# Patient Record
Sex: Male | Born: 1960
Health system: Southern US, Community
[De-identification: ages and names within clinical notes are randomized; demographics above are authoritative.]

## PROBLEM LIST (undated history)

## (undated) DIAGNOSIS — R079 Chest pain, unspecified: Secondary | ICD-10-CM

## (undated) DIAGNOSIS — K760 Fatty (change of) liver, not elsewhere classified: Secondary | ICD-10-CM

## (undated) DIAGNOSIS — K219 Gastro-esophageal reflux disease without esophagitis: Secondary | ICD-10-CM

## (undated) DIAGNOSIS — N179 Acute kidney failure, unspecified: Secondary | ICD-10-CM

## (undated) DIAGNOSIS — K8020 Calculus of gallbladder without cholecystitis without obstruction: Secondary | ICD-10-CM

## (undated) DIAGNOSIS — I10 Essential (primary) hypertension: Secondary | ICD-10-CM

## (undated) DIAGNOSIS — M1990 Unspecified osteoarthritis, unspecified site: Secondary | ICD-10-CM

## (undated) HISTORY — PX: WRIST SURGERY: SHX841

## (undated) HISTORY — PX: COLONOSCOPY: SHX174

---

## 2003-01-26 ENCOUNTER — Encounter: Payer: Self-pay | Admitting: *Deleted

## 2003-01-26 ENCOUNTER — Encounter: Admission: RE | Admit: 2003-01-26 | Discharge: 2003-01-26 | Payer: Self-pay | Admitting: *Deleted

## 2010-02-06 ENCOUNTER — Encounter: Admission: RE | Admit: 2010-02-06 | Discharge: 2010-02-06 | Payer: Self-pay | Admitting: Occupational Medicine

## 2010-05-29 ENCOUNTER — Encounter: Admission: RE | Admit: 2010-05-29 | Discharge: 2010-05-29 | Payer: Self-pay | Admitting: Neurology

## 2018-05-31 DIAGNOSIS — K8020 Calculus of gallbladder without cholecystitis without obstruction: Secondary | ICD-10-CM

## 2018-05-31 HISTORY — DX: Calculus of gallbladder without cholecystitis without obstruction: K80.20

## 2018-06-08 DIAGNOSIS — I10 Essential (primary) hypertension: Secondary | ICD-10-CM | POA: Diagnosis not present

## 2018-06-08 DIAGNOSIS — Z131 Encounter for screening for diabetes mellitus: Secondary | ICD-10-CM | POA: Diagnosis not present

## 2018-06-08 DIAGNOSIS — K219 Gastro-esophageal reflux disease without esophagitis: Secondary | ICD-10-CM | POA: Diagnosis not present

## 2018-06-08 DIAGNOSIS — Z125 Encounter for screening for malignant neoplasm of prostate: Secondary | ICD-10-CM | POA: Diagnosis not present

## 2018-06-09 DIAGNOSIS — K219 Gastro-esophageal reflux disease without esophagitis: Secondary | ICD-10-CM | POA: Diagnosis not present

## 2018-06-09 DIAGNOSIS — I10 Essential (primary) hypertension: Secondary | ICD-10-CM | POA: Diagnosis not present

## 2018-06-15 DIAGNOSIS — K219 Gastro-esophageal reflux disease without esophagitis: Secondary | ICD-10-CM | POA: Diagnosis not present

## 2018-06-15 DIAGNOSIS — I10 Essential (primary) hypertension: Secondary | ICD-10-CM | POA: Diagnosis not present

## 2018-06-23 ENCOUNTER — Emergency Department (HOSPITAL_COMMUNITY): Payer: 59

## 2018-06-23 ENCOUNTER — Observation Stay (HOSPITAL_COMMUNITY): Payer: 59

## 2018-06-23 ENCOUNTER — Observation Stay (HOSPITAL_COMMUNITY)
Admission: EM | Admit: 2018-06-23 | Discharge: 2018-06-24 | Disposition: A | Discharge status: Home / Self Care | Payer: 59 | Attending: Internal Medicine | Admitting: Internal Medicine

## 2018-06-23 ENCOUNTER — Other Ambulatory Visit: Payer: Self-pay

## 2018-06-23 ENCOUNTER — Encounter (HOSPITAL_COMMUNITY): Payer: Self-pay | Admitting: Emergency Medicine

## 2018-06-23 ENCOUNTER — Other Ambulatory Visit (HOSPITAL_COMMUNITY): Payer: 59

## 2018-06-23 DIAGNOSIS — K5900 Constipation, unspecified: Secondary | ICD-10-CM | POA: Diagnosis not present

## 2018-06-23 DIAGNOSIS — K30 Functional dyspepsia: Secondary | ICD-10-CM

## 2018-06-23 DIAGNOSIS — K8020 Calculus of gallbladder without cholecystitis without obstruction: Secondary | ICD-10-CM | POA: Diagnosis present

## 2018-06-23 DIAGNOSIS — R0789 Other chest pain: Principal | ICD-10-CM | POA: Diagnosis present

## 2018-06-23 DIAGNOSIS — R202 Paresthesia of skin: Secondary | ICD-10-CM | POA: Diagnosis not present

## 2018-06-23 DIAGNOSIS — K760 Fatty (change of) liver, not elsewhere classified: Secondary | ICD-10-CM | POA: Diagnosis present

## 2018-06-23 DIAGNOSIS — M5030 Other cervical disc degeneration, unspecified cervical region: Secondary | ICD-10-CM | POA: Diagnosis present

## 2018-06-23 DIAGNOSIS — R7989 Other specified abnormal findings of blood chemistry: Secondary | ICD-10-CM | POA: Insufficient documentation

## 2018-06-23 DIAGNOSIS — R000 Tachycardia, unspecified: Secondary | ICD-10-CM | POA: Diagnosis not present

## 2018-06-23 DIAGNOSIS — Z8249 Family history of ischemic heart disease and other diseases of the circulatory system: Secondary | ICD-10-CM | POA: Diagnosis not present

## 2018-06-23 DIAGNOSIS — R1114 Bilious vomiting: Secondary | ICD-10-CM | POA: Diagnosis not present

## 2018-06-23 DIAGNOSIS — M47816 Spondylosis without myelopathy or radiculopathy, lumbar region: Secondary | ICD-10-CM | POA: Diagnosis not present

## 2018-06-23 DIAGNOSIS — M542 Cervicalgia: Secondary | ICD-10-CM | POA: Diagnosis not present

## 2018-06-23 DIAGNOSIS — I10 Essential (primary) hypertension: Secondary | ICD-10-CM | POA: Diagnosis not present

## 2018-06-23 DIAGNOSIS — K7689 Other specified diseases of liver: Secondary | ICD-10-CM | POA: Insufficient documentation

## 2018-06-23 DIAGNOSIS — N179 Acute kidney failure, unspecified: Secondary | ICD-10-CM | POA: Diagnosis not present

## 2018-06-23 DIAGNOSIS — R61 Generalized hyperhidrosis: Secondary | ICD-10-CM

## 2018-06-23 DIAGNOSIS — R079 Chest pain, unspecified: Secondary | ICD-10-CM

## 2018-06-23 DIAGNOSIS — Z79899 Other long term (current) drug therapy: Secondary | ICD-10-CM | POA: Diagnosis not present

## 2018-06-23 DIAGNOSIS — Z7982 Long term (current) use of aspirin: Secondary | ICD-10-CM | POA: Insufficient documentation

## 2018-06-23 DIAGNOSIS — K219 Gastro-esophageal reflux disease without esophagitis: Secondary | ICD-10-CM | POA: Diagnosis not present

## 2018-06-23 DIAGNOSIS — R110 Nausea: Secondary | ICD-10-CM

## 2018-06-23 DIAGNOSIS — I088 Other rheumatic multiple valve diseases: Secondary | ICD-10-CM | POA: Insufficient documentation

## 2018-06-23 DIAGNOSIS — R42 Dizziness and giddiness: Secondary | ICD-10-CM | POA: Diagnosis not present

## 2018-06-23 DIAGNOSIS — R1013 Epigastric pain: Secondary | ICD-10-CM | POA: Diagnosis not present

## 2018-06-23 DIAGNOSIS — T542X1A Toxic effect of corrosive acids and acid-like substances, accidental (unintentional), initial encounter: Secondary | ICD-10-CM | POA: Diagnosis not present

## 2018-06-23 HISTORY — DX: Calculus of gallbladder without cholecystitis without obstruction: K80.20

## 2018-06-23 HISTORY — DX: Chest pain, unspecified: R07.9

## 2018-06-23 HISTORY — DX: Fatty (change of) liver, not elsewhere classified: K76.0

## 2018-06-23 HISTORY — DX: Essential (primary) hypertension: I10

## 2018-06-23 HISTORY — DX: Acute kidney failure, unspecified: N17.9

## 2018-06-23 LAB — CBC
HCT: 48.8 % (ref 39.0–52.0)
HEMATOCRIT: 43.2 % (ref 39.0–52.0)
HEMOGLOBIN: 13.6 g/dL (ref 13.0–17.0)
Hemoglobin: 15 g/dL (ref 13.0–17.0)
MCH: 26.5 pg (ref 26.0–34.0)
MCH: 27.3 pg (ref 26.0–34.0)
MCHC: 30.7 g/dL (ref 30.0–36.0)
MCHC: 31.5 g/dL (ref 30.0–36.0)
MCV: 86.1 fL (ref 80.0–100.0)
MCV: 86.6 fL (ref 80.0–100.0)
NRBC: 0 % (ref 0.0–0.2)
PLATELETS: 145 10*3/uL — AB (ref 150–400)
Platelets: 103 10*3/uL — ABNORMAL LOW (ref 150–400)
RBC: 5.67 MIL/uL (ref 4.22–5.81)
RBC: 4.99 MIL/uL (ref 4.22–5.81)
RDW: 13.4 % (ref 11.5–15.5)
RDW: 13.5 % (ref 11.5–15.5)
WBC: 4.7 10*3/uL (ref 4.0–10.5)
WBC: 4.1 10*3/uL (ref 4.0–10.5)
nRBC: 0 % (ref 0.0–0.2)

## 2018-06-23 LAB — HEPATIC FUNCTION PANEL
ALT: 199 U/L — AB (ref 0–44)
AST: 108 U/L — AB (ref 15–41)
Albumin: 3.8 g/dL (ref 3.5–5.0)
Alkaline Phosphatase: 58 U/L (ref 38–126)
BILIRUBIN DIRECT: 0.3 mg/dL — AB (ref 0.0–0.2)
BILIRUBIN TOTAL: 1.1 mg/dL (ref 0.3–1.2)
Indirect Bilirubin: 0.8 mg/dL (ref 0.3–0.9)
Total Protein: 7.3 g/dL (ref 6.5–8.1)

## 2018-06-23 LAB — BASIC METABOLIC PANEL
Anion gap: 11 (ref 5–15)
BUN: 18 mg/dL (ref 6–20)
CALCIUM: 9.6 mg/dL (ref 8.9–10.3)
CO2: 27 mmol/L (ref 22–32)
CREATININE: 1.58 mg/dL — AB (ref 0.61–1.24)
Chloride: 100 mmol/L (ref 98–111)
GFR calc Af Amer: 55 mL/min — ABNORMAL LOW (ref >60)
GFR, EST NON AFRICAN AMERICAN: 48 mL/min — AB (ref >60)
GLUCOSE: 160 mg/dL — AB (ref 70–99)
Potassium: 3.5 mmol/L (ref 3.5–5.1)
Sodium: 138 mmol/L (ref 135–145)

## 2018-06-23 LAB — CREATININE, SERUM
Creatinine, Ser: 1.28 mg/dL — ABNORMAL HIGH (ref 0.61–1.24)
GFR calc non Af Amer: >60 mL/min (ref >60)

## 2018-06-23 LAB — I-STAT TROPONIN, ED
TROPONIN I, POC: 0 ng/mL (ref 0.00–0.08)
TROPONIN I, POC: 0 ng/mL (ref 0.00–0.08)

## 2018-06-23 LAB — D-DIMER, QUANTITATIVE (NOT AT ARMC): D DIMER QUANT: 0.28 ug{FEU}/mL (ref 0.00–0.50)

## 2018-06-23 LAB — LIPASE, BLOOD: Lipase: 37 U/L (ref 11–51)

## 2018-06-23 LAB — CBG MONITORING, ED: Glucose-Capillary: 103 mg/dL — ABNORMAL HIGH (ref 70–99)

## 2018-06-23 MED ORDER — SIMETHICONE 80 MG PO CHEW
80.0000 mg | CHEWABLE_TABLET | Freq: Three times a day (TID) | ORAL | Status: DC
  Administered 2018-06-23 – 2018-06-24 (×4): 80 mg via ORAL
  Filled 2018-06-23 (×5): qty 1

## 2018-06-23 MED ORDER — ASPIRIN EC 81 MG PO TBEC: 81.0000 mg | DELAYED_RELEASE_TABLET | Freq: Every day | ORAL | Status: DC

## 2018-06-23 MED ORDER — ONDANSETRON HCL 4 MG/2ML IJ SOLN: 4.0000 mg | Freq: Four times a day (QID) | INTRAMUSCULAR | Status: DC | PRN

## 2018-06-23 MED ORDER — ASPIRIN 81 MG PO CHEW
324.0000 mg | CHEWABLE_TABLET | Freq: Once | ORAL | Status: AC
  Administered 2018-06-23: 324 mg via ORAL
  Filled 2018-06-23: qty 4

## 2018-06-23 MED ORDER — NITROGLYCERIN 0.4 MG SL SUBL: 0.4000 mg | SUBLINGUAL_TABLET | SUBLINGUAL | Status: DC | PRN

## 2018-06-23 MED ORDER — ONDANSETRON HCL 4 MG PO TABS: 4.0000 mg | ORAL_TABLET | Freq: Four times a day (QID) | ORAL | Status: DC | PRN

## 2018-06-23 MED ORDER — FLUTICASONE PROPIONATE 50 MCG/ACT NA SUSP
1.0000 | Freq: Every day | NASAL | Status: DC
  Administered 2018-06-23 – 2018-06-24 (×2): 1 via NASAL
  Filled 2018-06-23: qty 16

## 2018-06-23 MED ORDER — LIDOCAINE VISCOUS HCL 2 % MT SOLN
15.0000 mL | Freq: Once | OROMUCOSAL | Status: AC
  Administered 2018-06-23: 15 mL via ORAL
  Filled 2018-06-23: qty 15

## 2018-06-23 MED ORDER — DICYCLOMINE HCL 10 MG/5ML PO SOLN
10.0000 mg | Freq: Once | ORAL | Status: AC
  Administered 2018-06-23: 10 mg via ORAL
  Filled 2018-06-23: qty 5

## 2018-06-23 MED ORDER — ATORVASTATIN CALCIUM 20 MG PO TABS: 20.0000 mg | ORAL_TABLET | Freq: Every day | ORAL | Status: DC

## 2018-06-23 MED ORDER — SODIUM CHLORIDE 0.9 % IV SOLN
INTRAVENOUS | Status: DC
  Administered 2018-06-23 – 2018-06-24 (×3): via INTRAVENOUS

## 2018-06-23 MED ORDER — ASPIRIN 81 MG PO TBEC: 81.0000 mg | DELAYED_RELEASE_TABLET | Freq: Every day | ORAL | Status: DC

## 2018-06-23 MED ORDER — TRAMADOL HCL 50 MG PO TABS: 50.0000 mg | ORAL_TABLET | Freq: Four times a day (QID) | ORAL | Status: DC | PRN

## 2018-06-23 MED ORDER — ALUM & MAG HYDROXIDE-SIMETH 200-200-20 MG/5ML PO SUSP
15.0000 mL | Freq: Once | ORAL | Status: AC
  Administered 2018-06-23: 15 mL via ORAL
  Filled 2018-06-23: qty 30

## 2018-06-23 MED ORDER — FAMOTIDINE IN NACL 20-0.9 MG/50ML-% IV SOLN
20.0000 mg | Freq: Two times a day (BID) | INTRAVENOUS | Status: DC
  Administered 2018-06-23 – 2018-06-24 (×2): 20 mg via INTRAVENOUS
  Filled 2018-06-23 (×2): qty 50

## 2018-06-23 MED ORDER — LORATADINE 10 MG PO TABS
10.0000 mg | ORAL_TABLET | Freq: Every day | ORAL | Status: DC
  Filled 2018-06-23: qty 1

## 2018-06-23 MED ORDER — SODIUM CHLORIDE 0.9 % IV SOLN: INTRAVENOUS | Status: DC

## 2018-06-23 MED ORDER — SODIUM CHLORIDE 0.9 % IV BOLUS
1000.0000 mL | Freq: Once | INTRAVENOUS | Status: AC
  Administered 2018-06-23: 1000 mL via INTRAVENOUS

## 2018-06-23 MED ORDER — ENOXAPARIN SODIUM 40 MG/0.4ML ~~LOC~~ SOLN
40.0000 mg | SUBCUTANEOUS | Status: DC
  Filled 2018-06-23: qty 0.4

## 2018-06-23 MED ORDER — LOSARTAN POTASSIUM-HCTZ 50-12.5 MG PO TABS: 1.0000 | ORAL_TABLET | Freq: Every day | ORAL | Status: DC

## 2018-06-23 NOTE — ED Notes (Signed)
Patient transported to Ultrasound 

## 2018-06-23 NOTE — H&P (Signed)
History and Physical    DOA: 06/23/2018  PCP: Lucianne Lei, MD  Patient coming from: Home  Chief Complaint: Nausea with dizziness  HPI: Mark Villarreal is a 57 y.o. male with history h/o hypertension, GERD presented with complaints of epigastric discomfort associated with bloating sensation, nausea, dizziness and sweating.  Patient has chronic reflux disease and states he has had these episodes before but more severe now and association with dizziness and sweating is new. Separately he also mentions mild neck and subclavicular chest wall pain associated with tingling along the left upper extremity when he turns his head to the left side.  He clearly gives a positional component and states this has been going on for couple of months.  He has not undergone any work-up for this and he was able to reproduce the symptoms by turning his neck to the left side during my interview/exam.  He denies any exercise intolerance or chest pain per se.  During the work-up in the ED, cardiac symptoms were suspected and cardiac work-up was initiated.  EKG does not have any acute changes and first set of troponin is negative.  Chest x-ray is negative. Labs showed mildly elevated liver enzymes which prompted gallbladder work-up as well.  Hospitalist service called to admit this patient for observation/chest pain evaluation.   Review of Systems: As per HPI otherwise 10 point review of systems negative.    Past Medical History:  Diagnosis Date  . Hypertension   Gastroesophageal reflux disease Social history:  reports that he has never smoked. He has never used smokeless tobacco. He reports that he drinks alcohol. He reports that he does not use drugs.   No Known Allergies  Family history: Patient gives history of CAD in mother and stroke in father   Prior to Admission medications   Medication Sig Start Date End Date Taking? Authorizing Provider  ASPIRIN LOW DOSE 81 MG EC tablet Take 81 mg by mouth at  bedtime.  06/09/18  Yes [provider]  atorvastatin (LIPITOR) 20 MG tablet Take 20 mg by mouth at bedtime. 06/09/18  Yes [provider]  cetirizine (ZYRTEC) 10 MG tablet Take 10 mg by mouth daily. 06/08/18  Yes [provider]  fluticasone (FLONASE) 50 MCG/ACT nasal spray Place 1 spray into both nostrils daily. 06/08/18  Yes [provider]  losartan-hydrochlorothiazide (HYZAAR) 50-12.5 MG tablet Take 1 tablet by mouth at bedtime.  06/08/18  Yes [provider]  traMADol (ULTRAM) 50 MG tablet Take 50-100 mg by mouth every 4 (four) hours as needed. 06/04/18  Yes [provider]  Azilsartan-Chlorthalidone (EDARBYCLOR PO) Take by mouth.    [provider]  penicillin v potassium (VEETID) 500 MG tablet Take 500 mg by mouth daily. 06/04/18   [provider]    Physical Exam: Vitals:   06/23/18 1600 06/23/18 1615 06/23/18 1630 06/23/18 1714  BP: (!) 150/97 (!) 137/93 (!) 154/91 (!) 141/86  Pulse: 88 87 86 83  Resp: 15 (!) 27 18 16   Temp:    99 F (37.2 C)  TempSrc:      SpO2: 95% 97% 96% 99%  Weight:      Height:        Constitutional: NAD, calm, comfortable Vitals:   06/23/18 1600 06/23/18 1615 06/23/18 1630 06/23/18 1714  BP: (!) 150/97 (!) 137/93 (!) 154/91 (!) 141/86  Pulse: 88 87 86 83  Resp: 15 (!) 27 18 16   Temp:    99 F (37.2 C)  TempSrc:      SpO2: 95% 97% 96% 99%  Weight:      Height:       Eyes: PERRL, lids and conjunctivae normal ENMT: Mucous membranes are moist. Posterior pharynx clear of any exudate or lesions.Normal dentition.  Neck: normal, supple, no masses, no thyromegaly Respiratory: clear to auscultation bilaterally, no wheezing, no crackles. Normal respiratory effort. No accessory muscle use.  Cardiovascular: Regular rate and rhythm, no murmurs / rubs / gallops. No extremity edema. 2+ pedal pulses. No carotid bruits.  Abdomen: Epigastric tenderness, no rebound or guarding, no masses  palpated. No hepatosplenomegaly. Bowel sounds positive.  Musculoskeletal: no clubbing / cyanosis. No joint deformity upper and lower extremities. Good ROM, no contractures. Normal muscle tone.  Neurologic: CN 2-12 grossly intact. Sensation intact, DTR normal. Strength 5/5 in all 4.  (Tingling along left upper extremity on turning his neck to the left side elicited) Psychiatric: Normal judgment and insight. Alert and oriented x 3. Normal mood.  SKIN/catheters: no rashes, lesions, ulcers. No induration  Labs on Admission: I have personally reviewed following labs and imaging studies  CBC: Recent Labs  Lab 06/23/18 0957  WBC 4.7  HGB 15.0  HCT 48.8  MCV 86.1  PLT 025*   Basic Metabolic Panel: Recent Labs  Lab 06/23/18 0957  NA 138  K 3.5  CL 100  CO2 27  GLUCOSE 160*  BUN 18  CREATININE 1.58*  CALCIUM 9.6   GFR: Estimated Creatinine Clearance: 60.4 mL/min (A) (by C-G formula based on SCr of 1.58 mg/dL (H)). Liver Function Tests: Recent Labs  Lab 06/23/18 0957  AST 108*  ALT 199*  ALKPHOS 58  BILITOT 1.1  PROT 7.3  ALBUMIN 3.8   Recent Labs  Lab 06/23/18 0957  LIPASE 37   No results for input(s): AMMONIA in the last 168 hours. Coagulation Profile: No results for input(s): INR, PROTIME in the last 168 hours. Cardiac Enzymes: No results for input(s): CKTOTAL, CKMB, CKMBINDEX, TROPONINI in the last 168 hours. BNP (last 3 results) No results for input(s): PROBNP in the last 8760 hours. HbA1C: No results for input(s): HGBA1C in the last 72 hours. CBG: Recent Labs  Lab 06/23/18 1137  GLUCAP 103*   Lipid Profile: No results for input(s): CHOL, HDL, LDLCALC, TRIG, CHOLHDL, LDLDIRECT in the last 72 hours. Thyroid Function Tests: No results for input(s): TSH, T4TOTAL, FREET4, T3FREE, THYROIDAB in the last 72 hours. Anemia Panel: No results for input(s): VITAMINB12, FOLATE, FERRITIN, TIBC, IRON, RETICCTPCT in the last 72 hours. Urine analysis: No results found  for: COLORURINE, APPEARANCEUR, LABSPEC, PHURINE, GLUCOSEU, HGBUR, BILIRUBINUR, KETONESUR, PROTEINUR, UROBILINOGEN, NITRITE, LEUKOCYTESUR  Radiological Exams on Admission: Dg Chest 2 View  Result Date: 06/23/2018 CLINICAL DATA:  Mid chest pain. EXAM: CHEST - 2 VIEW COMPARISON:  None. FINDINGS: The heart size and mediastinal contours are within normal limits. Both lungs are clear. The visualized skeletal structures are unremarkable. IMPRESSION: No active cardiopulmonary disease. Electronically Signed   By: Fidela Salisbury M.D.   On: 06/23/2018 10:27   US Abdomen Limited Ruq  Result Date: 06/23/2018 CLINICAL DATA:  Bilious emesis EXAM: ULTRASOUND ABDOMEN LIMITED RIGHT UPPER QUADRANT COMPARISON:  None. FINDINGS: Gallbladder: Within the gallbladder, there are echogenic foci which move and shadow consistent cholelithiasis. Largest gallstone measures 8 mm. There is a 4 mm echogenic focus in the gallbladder which does not move or shadow, a likely polyp. There is no gallbladder wall thickening or pericholecystic fluid. No sonographic Murphy sign noted by sonographer. Common  bile duct: Diameter: 6 mm. No intrahepatic or extrahepatic biliary duct dilatation. Liver: There is a cyst in the right lobe of the liver measuring 1.5 x 1.4 x 1.5 cm. Liver echogenicity overall is increased. Portal vein is patent on color Doppler imaging with normal direction of blood flow towards the liver. IMPRESSION: 1. Cholelithiasis. No gallbladder wall thickening or pericholecystic fluid. There is a 4 mm apparent polyp along the gallbladder wall as well. Per consensus guidelines, a polyp of this small size does not warrant additional imaging surveillance. 2. Small cyst in right lobe of liver. No other focal liver lesion identified. There is increase in liver echogenicity, a finding indicative of underlying hepatic steatosis. Electronically Signed   By: Lowella Grip III M.D.   On: 06/23/2018 16:20    Assessment and Plan:    1.  Dyspepsia/dizziness/sweating: Likely vagal reaction to gastric symptoms.  Will admit for observation given severe nausea with IV emetics, IV fluids and clear liquid diet with instructions to advance as tolerated.  2.  Atypical subclavicular/upper chest wall pain: Patient denies any retrosternal/parasternal left-sided chest pain.  I do not suspect cardiac symptoms at this point.  Subclavicular pain appears to be related to neck issues.  Will however cycle cardiac enzymes overnight.  Obtain echo  3.  Neck pain/left upper extremity tingling: Appears to be related to cervical disease.  Will start off with x-ray to rule out spondylosis, if negative consider MRI neck given ongoing symptoms.  4.  Hypertension hold diuretics and ACE inhibitors in concern for renal function.  5.  Elevated LFTs: Bilirubin appears normal.?  Medication related.  Drinks alcohol occasionally.  Ultrasonogram gallbladder appears normal except for a small gallbladder nonobstructing polyp.  Check hepatitis profile  6.?  AKI: No old labs to compare.  Patient on diuretics/ACE inhibitors at baseline.  Hold and hydrate  DVT prophylaxis: Lovenox  Code Status: Full code  Family Communication: Discussed with patient. Health care proxy would be  Consults called: None Admission status:  Patient admitted as observation as anticipated LOS less than 2 midnights    Guilford Shi MD [facility] Pager [phone]  If 7PM-7AM, please contact night-coverage [email or web address] Password [facility]  06/23/2018, 5:28 PM

## 2018-06-23 NOTE — ED Notes (Signed)
Blood work then [employer]

## 2018-06-23 NOTE — Progress Notes (Signed)
Called pharmacy to follow up on earlier call to clarify pt meds since no one had called me.

## 2018-06-23 NOTE — Progress Notes (Signed)
Pt transported to xray 

## 2018-06-23 NOTE — ED Triage Notes (Signed)
Pt reports being at work when he started sweating real bad, became nauseated and lightheaded. Reports pain and pressure in the center of his chest, denies radiation. Denies  Hx. He's diaphoretic at triage.

## 2018-06-23 NOTE — ED Provider Notes (Signed)
[place] EMERGENCY DEPARTMENT Provider Note   CSN: [id number] Arrival date & time: 06/23/18  0941     History   Chief Complaint Chief Complaint  Patient presents with  . Dizziness  . Chest Pain  . Nausea    HPI Mark Villarreal is a 57 y.o. male who presents the emergency department with chief complaint of chest pain.  The patient was sent in from his primary care physician's office.  He was recently diagnosed with hypertension and reflux.  Patient states that today he was at work when he suddenly broke out into a cold sweat.  He began having chest pain nausea and reflux symptoms.  That he felt extremely lightheaded.  He was got into the company vehicle with a partner but then abruptly asked his partner to take him back to his car.  The patient left work and drove home and asked his wife to take him to the doctor.  He was having waves of nausea and feeling as if he was going to pass out.  He denies racing or skipping in his heart.  Patient continues to have some symptoms including nausea.  He noticed some pain in the left side of his neck and radiating down the left arm.  He states that he has a history of pain that radiates down the left arm and into the left thumb when he turns his neck.  This is been going on for some time and the symptoms today seem similar. The patient has also been dealing with some recent constipation due to the new hypertension medications he is taking and tramadol use for a bad tooth.  He was also recently treated with penicillin for his tooth.  Patient states that he found out he was hypertensive because at the dentist office his blood pressure was really high.  He denies a history of high cholesterol he denies a history of smoking his mother died of a heart attack but was older than 29.  Patient takes a daily baby aspirin. Patient reports that he had tried some dietary changes and cut out alcohol use changed his portion sizes cut out salt and  has been drinking only water.  \  HPI  Past Medical History:  Diagnosis Date  . Hypertension     There are no active problems to display for this patient.   History reviewed. No pertinent surgical history.      Home Medications    Prior to Admission medications   Not on File    Family History No family history on file.  Social History Social History   Tobacco Use  . Smoking status: Never Smoker  . Smokeless tobacco: Never Used  Substance Use Topics  . Alcohol use: Yes  . Drug use: Never     Allergies   Patient has no known allergies.   Review of Systems Review of Systems Ten systems reviewed and are negative for acute change, except as noted in the HPI.    Physical Exam Updated Vital Signs BP (!) 138/94   Pulse (!) 104   Temp 98.7 F (37.1 C) (Oral)   Resp 17   Ht 5\' 9"  (1.753 m)   Wt 96.2 kg   SpO2 100%   BMI 31.31 kg/m   Physical Exam  Constitutional: He appears well-developed and well-nourished. No distress.  HENT:  Head: Normocephalic and atraumatic.  Eyes: Conjunctivae are normal. No scleral icterus.  Neck: Normal range of motion. Neck supple.  Cardiovascular: Normal  rate, regular rhythm and normal heart sounds.  Pulmonary/Chest: Effort normal and breath sounds normal. No respiratory distress.  Abdominal: Soft. There is no tenderness.  Musculoskeletal: He exhibits no edema.  Neurological: He is alert.  Skin: Skin is warm and dry. He is not diaphoretic.  Psychiatric: His behavior is normal.  Nursing note and vitals reviewed.    ED Treatments / Results  Labs (all labs ordered are listed, but only abnormal results are displayed) Labs Reviewed  CBC - Abnormal; Notable for the following components:      Result Value   Platelets 145 (*)    All other components within normal limits  BASIC METABOLIC PANEL  I-STAT TROPONIN, ED    EKG None  Radiology Dg Chest 2 View  Result Date: 06/23/2018 CLINICAL DATA:  Mid chest pain.  EXAM: CHEST - 2 VIEW COMPARISON:  None. FINDINGS: The heart size and mediastinal contours are within normal limits. Both lungs are clear. The visualized skeletal structures are unremarkable. IMPRESSION: No active cardiopulmonary disease. Electronically Signed   By: Fidela Salisbury M.D.   On: 06/23/2018 10:27    Procedures Procedures (including critical care time)  Medications Ordered in ED Medications - No data to display   Initial Impression / Assessment and Plan / ED Course  I have reviewed the triage vital signs and the nursing notes.  Pertinent labs & imaging results that were available during my care of the patient were reviewed by me and considered in my medical decision making (see chart for details).  Clinical Course as of Jun 23 1646  Thu Jun 23, 2018  1227 AST(!): 108 [AH]  1227 ALT(!): 199 [AH]  1352 HEART score 5 - moderate risk   [AH]    Clinical Course User Index [AH] Margarita Mail, PA-C    57 year old male with heart score of 5, story is extremely concerning for ACS however he also has multiple reasons to have GI causes.  History is gathered from both the patient and his wife who is at bedside.  The emergent differential diagnosis of chest pain includes: Acute coronary syndrome, pericarditis, aortic dissection, pulmonary embolism, tension pneumothorax, pneumonia, and esophageal rupture.  Patient's initial EKG shows normal sinus rhythm with a normal rate.  His chest x-ray shows no acute abnormalities and I personally reviewed the PA and lateral chest film and agree with the radiologist check interpretation.  The patient's liver enzymes are slightly elevated which may be secondary to his previous alcohol abuse versus steatohepatitis is.  His ultrasound shows cholelithiasis and gallbladder polyp without evidence of acute cholecystitis and he has a benign-appearing cyst in his liver.  Patient will be admitted to the hospitalist service.  He is stable throughout his  visit.    Final Clinical Impressions(s) / ED Diagnoses   Final diagnoses:  None    ED Discharge Orders    None       Margarita Mail, PA-C 06/23/18 1650    Gareth Morgan, MD 06/24/18 2242

## 2018-06-23 NOTE — Progress Notes (Signed)
Called pharmacy for help with reconciling meds. Pt takes aspirin , 1/2 tab edarbi (40 mg) and 1 HCTZ. Pharm tech will talk to pharmacist to discern what pt should be taking and will call back.

## 2018-06-24 ENCOUNTER — Encounter (HOSPITAL_COMMUNITY): Payer: Self-pay | Admitting: Internal Medicine

## 2018-06-24 ENCOUNTER — Observation Stay (HOSPITAL_BASED_OUTPATIENT_CLINIC_OR_DEPARTMENT_OTHER): Payer: 59

## 2018-06-24 DIAGNOSIS — K760 Fatty (change of) liver, not elsewhere classified: Secondary | ICD-10-CM | POA: Diagnosis present

## 2018-06-24 DIAGNOSIS — M5030 Other cervical disc degeneration, unspecified cervical region: Secondary | ICD-10-CM | POA: Diagnosis present

## 2018-06-24 DIAGNOSIS — I370 Nonrheumatic pulmonary valve stenosis: Secondary | ICD-10-CM | POA: Diagnosis not present

## 2018-06-24 DIAGNOSIS — K30 Functional dyspepsia: Secondary | ICD-10-CM

## 2018-06-24 DIAGNOSIS — I10 Essential (primary) hypertension: Secondary | ICD-10-CM | POA: Diagnosis present

## 2018-06-24 DIAGNOSIS — K8020 Calculus of gallbladder without cholecystitis without obstruction: Secondary | ICD-10-CM

## 2018-06-24 DIAGNOSIS — I340 Nonrheumatic mitral (valve) insufficiency: Secondary | ICD-10-CM

## 2018-06-24 DIAGNOSIS — R0789 Other chest pain: Secondary | ICD-10-CM | POA: Diagnosis not present

## 2018-06-24 LAB — COMPREHENSIVE METABOLIC PANEL
ALT: 148 U/L — AB (ref 0–44)
AST: 76 U/L — AB (ref 15–41)
Albumin: 2.8 g/dL — ABNORMAL LOW (ref 3.5–5.0)
Alkaline Phosphatase: 45 U/L (ref 38–126)
Anion gap: 7 (ref 5–15)
BILIRUBIN TOTAL: 1.2 mg/dL (ref 0.3–1.2)
BUN: 10 mg/dL (ref 6–20)
CHLORIDE: 106 mmol/L (ref 98–111)
CO2: 26 mmol/L (ref 22–32)
CREATININE: 1.14 mg/dL (ref 0.61–1.24)
Calcium: 8.7 mg/dL — ABNORMAL LOW (ref 8.9–10.3)
Glucose, Bld: 95 mg/dL (ref 70–99)
POTASSIUM: 3.9 mmol/L (ref 3.5–5.1)
SODIUM: 139 mmol/L (ref 135–145)
TOTAL PROTEIN: 5.5 g/dL — AB (ref 6.5–8.1)

## 2018-06-24 LAB — CBC
HEMATOCRIT: 40.9 % (ref 39.0–52.0)
HEMOGLOBIN: 12.7 g/dL — AB (ref 13.0–17.0)
MCH: 27 pg (ref 26.0–34.0)
MCHC: 31.1 g/dL (ref 30.0–36.0)
MCV: 87 fL (ref 80.0–100.0)
NRBC: 0 % (ref 0.0–0.2)
Platelets: 95 10*3/uL — ABNORMAL LOW (ref 150–400)
RBC: 4.7 MIL/uL (ref 4.22–5.81)
RDW: 13.6 % (ref 11.5–15.5)
WBC: 3.3 10*3/uL — ABNORMAL LOW (ref 4.0–10.5)

## 2018-06-24 LAB — ECHOCARDIOGRAM COMPLETE
HEIGHTINCHES: 69 in
Weight: 3392 oz

## 2018-06-24 MED ORDER — SIMETHICONE 80 MG PO CHEW: 80.0000 mg | CHEWABLE_TABLET | Freq: Three times a day (TID) | ORAL | 0 refills | Status: DC

## 2018-06-24 MED ORDER — HYDROCHLOROTHIAZIDE 12.5 MG PO CAPS
12.5000 mg | ORAL_CAPSULE | Freq: Every day | ORAL | Status: DC
  Administered 2018-06-24: 12.5 mg via ORAL
  Filled 2018-06-24: qty 1

## 2018-06-24 MED ORDER — FAMOTIDINE 20 MG PO TABS: 20.0000 mg | ORAL_TABLET | Freq: Two times a day (BID) | ORAL | 1 refills | Status: DC

## 2018-06-24 MED ORDER — FAMOTIDINE 20 MG PO TABS: 20.0000 mg | ORAL_TABLET | Freq: Two times a day (BID) | ORAL | Status: DC

## 2018-06-24 MED ORDER — LOSARTAN POTASSIUM 50 MG PO TABS
50.0000 mg | ORAL_TABLET | Freq: Every day | ORAL | Status: DC
  Administered 2018-06-24: 50 mg via ORAL
  Filled 2018-06-24: qty 1

## 2018-06-24 NOTE — Discharge Summary (Signed)
Physician Discharge Summary  Mark Villarreal KNL:[id number] DOB: 1961-08-10 DOA: 06/23/2018  PCP: Lucianne Lei, MD  Admit date: 06/23/2018 Discharge date: 06/24/2018  Time spent: 45 minutes  Recommendations for Outpatient Follow-up:  1. Follow up with PCP 1-2 weeks for referral for further workup on neck. Recommend tracking LFT's and need for statin in setting of elevated transaminase. GERD management as discharged on pepcid   Discharge Diagnoses:  Principal Problem:   Atypical chest pain Active Problems:   Degeneration of cervical intervertebral disc   Cholelithiasis   Hypertension   Hepatic steatosis   Discharge Condition: stable and pain free  Diet recommendation: heart healthy   Filed Weights   06/23/18 0949  Weight: 96.2 kg    History of present illness:  Mark Villarreal is a 57 y.o. male with history h/o hypertension, GERD presented on 10/24 with complaints of epigastric discomfort associated with bloating sensation, nausea, dizziness and sweating.  Patient has chronic reflux disease and stated he had these episodes before but more severe now and association with dizziness and sweating is new.  Separately he also mentioned mild neck and subclavicular chest wall pain associated with tingling along the left upper extremity when he turns his head to the left side. He reports a fall with head and neck injury at work years ago. At that time evaluated by neurologist. Patient reports worsening symptoms.  He clearly gives a positional component and stated this has been going on for couple of months.    During the work-up in the ED, cardiac symptoms were suspected and cardiac work-up was initiated.  EKG does not have any acute changes and first set of troponin is negative.  Chest x-ray is negative. Labs showed mildly elevated liver enzymes which prompted gallbladder work-up as well.  Hospitalist service called to admit this patient for observation/chest pain  evaluation.  Hospital Course:  1.  Dyspepsia/dizziness/sweating/abdominal pain: Likely vagal reaction to gastric symptoms. Some concern for biliary colic as well given elevated transaminases and RUQ abdominal US with cholelithiasis and liver echogencicity concerning for underlying hepatic steatosis. Also a long hx of severe dyspepsia. Provided with  IV emetics, IV fluids and clear liquid diet. On day of discharge pain free. Tolerated regular diet. Provided Pepcid and instructions to follow up with PCP. May benefit OP HIDA scan.   2.  Atypical subclavicular/upper chest wall pain: Patient denied any retrosternal/parasternal left-sided chest pain. Subclavicular pain appears to be related to neck issues. Troponin negative. EKG without acute changes. Echo reveals. Pain reproducable with head movement and palpation. C-spine xrays reveal degenerative disc disease. recommendn OP follow up 3.  Neck pain/left upper extremity tingling: Appears to be related to cervical disease. See #2. Hx fall/injury at work. Patient prefers OP work up as this is [employer] nurse comp" injury and he must be seen by whoever they recommend 4.  Hypertension Diuretics and ACE inhibitors held initially in concern for renal function. At discharge meds resumed and creatinine within limits of normal. OP monitoring  5.  Elevated LFTs: Bilirubin appears normal.  Drinks alcohol only  occasionally.  home meds include lipitor  6.?  AKI: Resolved at discharge. No old labs to compare.  Patient on diuretics/ACE inhibitors at baseline. These were held at time of admission. Resume at discharge. Follow up with PCP for monitoring of kidney function  rophylaxis: Lovenox  Procedures: echo Consultations:  none  Discharge Exam: Vitals:   06/24/18 0528 06/24/18 0900  BP: (!) 132/93 (!) 141/103  Pulse: 68 73  Resp: 16 15  Temp: 98.1 F (36.7 C) 98.6 F (37 C)  SpO2: 99% 97%    General: awake alert in no acute distress Cardiovascular: rrr  no MGR noL edema Respiratory: normal effort BS clear bilaterally no wheeze Neuro: alert and oriented x3 speech clear bilateral grip 5.5 LE strength 5/5  Discharge Instructions   Discharge Instructions    Diet - low sodium heart healthy   Complete by:  As directed    Increase activity slowly   Complete by:  As directed      Allergies as of 06/24/2018   No Known Allergies     Medication List    STOP taking these medications   penicillin v potassium 500 MG tablet Commonly known as:  VEETID     TAKE these medications   ASPIRIN LOW DOSE 81 MG EC tablet Generic drug:  aspirin Take 81 mg by mouth at bedtime.   atorvastatin 20 MG tablet Commonly known as:  LIPITOR Take 20 mg by mouth at bedtime.   cetirizine 10 MG tablet Commonly known as:  ZYRTEC Take 10 mg by mouth daily.   EDARBYCLOR PO Take by mouth.   famotidine 20 MG tablet Commonly known as:  PEPCID Take 1 tablet (20 mg total) by mouth 2 (two) times daily.   fluticasone 50 MCG/ACT nasal spray Commonly known as:  FLONASE Place 1 spray into both nostrils daily.   losartan-hydrochlorothiazide 50-12.5 MG tablet Commonly known as:  HYZAAR Take 1 tablet by mouth at bedtime.   simethicone 80 MG chewable tablet Commonly known as:  MYLICON Chew 1 tablet (80 mg total) by mouth 3 (three) times daily.   traMADol 50 MG tablet Commonly known as:  ULTRAM Take 50-100 mg by mouth every 4 (four) hours as needed.      No Known Allergies    The results of significant diagnostics from this hospitalization (including imaging, microbiology, ancillary and laboratory) are listed below for reference.    Significant Diagnostic Studies: Dg Chest 2 View  Result Date: 06/23/2018 CLINICAL DATA:  Mid chest pain. EXAM: CHEST - 2 VIEW COMPARISON:  None. FINDINGS: The heart size and mediastinal contours are within normal limits. Both lungs are clear. The visualized skeletal structures are unremarkable. IMPRESSION: No active  cardiopulmonary disease. Electronically Signed   By: Fidela Salisbury M.D.   On: 06/23/2018 10:27   Dg Cervical Spine Complete  Result Date: 06/23/2018 CLINICAL DATA:  Neck pain EXAM: CERVICAL SPINE - COMPLETE 4+ VIEW COMPARISON:  05/29/2010 FINDINGS: Anterior degenerative spurring. Normal alignment. Right neural foraminal narrowing due to uncovertebral spurring at C3-4 and C4-5. Left neural foraminal narrowing at C5-6 and C6-7. No fracture. Prevertebral soft tissues are normal. IMPRESSION: Degenerative changes as above with mild bilateral neural foraminal narrowing. No acute bony abnormality. Electronically Signed   By: Rolm Baptise M.D.   On: 06/23/2018 20:50   US Abdomen Limited Ruq  Result Date: 06/23/2018 CLINICAL DATA:  Bilious emesis EXAM: ULTRASOUND ABDOMEN LIMITED RIGHT UPPER QUADRANT COMPARISON:  None. FINDINGS: Gallbladder: Within the gallbladder, there are echogenic foci which move and shadow consistent cholelithiasis. Largest gallstone measures 8 mm. There is a 4 mm echogenic focus in the gallbladder which does not move or shadow, a likely polyp. There is no gallbladder wall thickening or pericholecystic fluid. No sonographic Murphy sign noted by sonographer. Common bile duct: Diameter: 6 mm. No intrahepatic or extrahepatic biliary duct dilatation. Liver: There is a cyst in the right lobe of the liver measuring 1.5 x  1.4 x 1.5 cm. Liver echogenicity overall is increased. Portal vein is patent on color Doppler imaging with normal direction of blood flow towards the liver. IMPRESSION: 1. Cholelithiasis. No gallbladder wall thickening or pericholecystic fluid. There is a 4 mm apparent polyp along the gallbladder wall as well. Per consensus guidelines, a polyp of this small size does not warrant additional imaging surveillance. 2. Small cyst in right lobe of liver. No other focal liver lesion identified. There is increase in liver echogenicity, a finding indicative of underlying hepatic  steatosis. Electronically Signed   By: Lowella Grip III M.D.   On: 06/23/2018 16:20    Microbiology: No results found for this or any previous visit (from the past 240 hour(s)).   Labs: Basic Metabolic Panel: Recent Labs  Lab 06/23/18 0957 06/23/18 1639 06/24/18 0639  NA 138  --  139  K 3.5  --  3.9  CL 100  --  106  CO2 27  --  26  GLUCOSE 160*  --  95  BUN 18  --  10  CREATININE 1.58* 1.28* 1.14  CALCIUM 9.6  --  8.7*   Liver Function Tests: Recent Labs  Lab 06/23/18 0957 06/24/18 0639  AST 108* 76*  ALT 199* 148*  ALKPHOS 58 45  BILITOT 1.1 1.2  PROT 7.3 5.5*  ALBUMIN 3.8 2.8*   Recent Labs  Lab 06/23/18 0957  LIPASE 37   No results for input(s): AMMONIA in the last 168 hours. CBC: Recent Labs  Lab 06/23/18 0957 06/23/18 1639 06/24/18 0639  WBC 4.7 4.1 3.3*  HGB 15.0 13.6 12.7*  HCT 48.8 43.2 40.9  MCV 86.1 86.6 87.0  PLT 145* 103* 95*   Cardiac Enzymes: No results for input(s): CKTOTAL, CKMB, CKMBINDEX, TROPONINI in the last 168 hours. BNP: BNP (last 3 results) No results for input(s): BNP in the last 8760 hours.  ProBNP (last 3 results) No results for input(s): PROBNP in the last 8760 hours.  CBG: Recent Labs  Lab 06/23/18 1137  GLUCAP 103*       Signed:  Radene Gunning MD.  [facility] 06/24/2018, 12:56 PM

## 2018-06-24 NOTE — Progress Notes (Signed)
  Echocardiogram 2D Echocardiogram has been performed.  Mark Villarreal 06/24/2018, 11:23 AM

## 2018-06-24 NOTE — Progress Notes (Signed)
Assumed care from Domingo Mend, RN

## 2018-06-25 LAB — HIV ANTIBODY (ROUTINE TESTING W REFLEX): HIV Screen 4th Generation wRfx: NONREACTIVE

## 2018-07-14 ENCOUNTER — Encounter (HOSPITAL_COMMUNITY): Payer: Self-pay | Admitting: Emergency Medicine

## 2018-07-14 ENCOUNTER — Emergency Department (HOSPITAL_COMMUNITY): Payer: 59

## 2018-07-14 ENCOUNTER — Other Ambulatory Visit: Payer: Self-pay

## 2018-07-14 ENCOUNTER — Emergency Department (HOSPITAL_COMMUNITY)
Admission: EM | Admit: 2018-07-14 | Discharge: 2018-07-14 | Disposition: A | Discharge status: Home / Self Care | Payer: 59 | Attending: Emergency Medicine | Admitting: Emergency Medicine

## 2018-07-14 DIAGNOSIS — R194 Change in bowel habit: Secondary | ICD-10-CM | POA: Diagnosis not present

## 2018-07-14 DIAGNOSIS — Z7982 Long term (current) use of aspirin: Secondary | ICD-10-CM | POA: Diagnosis not present

## 2018-07-14 DIAGNOSIS — Z79899 Other long term (current) drug therapy: Secondary | ICD-10-CM | POA: Insufficient documentation

## 2018-07-14 DIAGNOSIS — K6289 Other specified diseases of anus and rectum: Secondary | ICD-10-CM | POA: Diagnosis present

## 2018-07-14 DIAGNOSIS — K219 Gastro-esophageal reflux disease without esophagitis: Secondary | ICD-10-CM | POA: Diagnosis not present

## 2018-07-14 DIAGNOSIS — I10 Essential (primary) hypertension: Secondary | ICD-10-CM | POA: Diagnosis not present

## 2018-07-14 DIAGNOSIS — E876 Hypokalemia: Secondary | ICD-10-CM | POA: Diagnosis not present

## 2018-07-14 DIAGNOSIS — K8020 Calculus of gallbladder without cholecystitis without obstruction: Secondary | ICD-10-CM | POA: Diagnosis not present

## 2018-07-14 DIAGNOSIS — K600 Acute anal fissure: Secondary | ICD-10-CM | POA: Diagnosis not present

## 2018-07-14 LAB — COMPREHENSIVE METABOLIC PANEL
ALBUMIN: 3.2 g/dL — AB (ref 3.5–5.0)
ALT: 177 U/L — ABNORMAL HIGH (ref 0–44)
ANION GAP: 13 (ref 5–15)
AST: 85 U/L — ABNORMAL HIGH (ref 15–41)
Alkaline Phosphatase: 45 U/L (ref 38–126)
BILIRUBIN TOTAL: 1.8 mg/dL — AB (ref 0.3–1.2)
BUN: 21 mg/dL — ABNORMAL HIGH (ref 6–20)
CO2: 25 mmol/L (ref 22–32)
Calcium: 9.3 mg/dL (ref 8.9–10.3)
Chloride: 98 mmol/L (ref 98–111)
Creatinine, Ser: 1.42 mg/dL — ABNORMAL HIGH (ref 0.61–1.24)
GFR, EST NON AFRICAN AMERICAN: 54 mL/min — AB (ref >60)
Glucose, Bld: 96 mg/dL (ref 70–99)
POTASSIUM: 2.8 mmol/L — AB (ref 3.5–5.1)
Sodium: 136 mmol/L (ref 135–145)
TOTAL PROTEIN: 6.6 g/dL (ref 6.5–8.1)

## 2018-07-14 LAB — URINALYSIS, ROUTINE W REFLEX MICROSCOPIC
Bilirubin Urine: NEGATIVE
Glucose, UA: NEGATIVE mg/dL
Hgb urine dipstick: NEGATIVE
KETONES UR: 20 mg/dL — AB
LEUKOCYTES UA: NEGATIVE
NITRITE: NEGATIVE
PROTEIN: NEGATIVE mg/dL
Specific Gravity, Urine: >1.046 — ABNORMAL HIGH (ref 1.005–1.030)
pH: 6 (ref 5.0–8.0)

## 2018-07-14 LAB — CBC WITH DIFFERENTIAL/PLATELET
Abs Immature Granulocytes: 0.01 10*3/uL (ref 0.00–0.07)
BASOS PCT: 0 %
Basophils Absolute: 0 10*3/uL (ref 0.0–0.1)
EOS ABS: 0 10*3/uL (ref 0.0–0.5)
Eosinophils Relative: 0 %
HCT: 47.2 % (ref 39.0–52.0)
Hemoglobin: 15.1 g/dL (ref 13.0–17.0)
IMMATURE GRANULOCYTES: 0 %
Lymphocytes Relative: 34 %
Lymphs Abs: 1.6 10*3/uL (ref 0.7–4.0)
MCH: 26.7 pg (ref 26.0–34.0)
MCHC: 32 g/dL (ref 30.0–36.0)
MCV: 83.5 fL (ref 80.0–100.0)
MONOS PCT: 18 %
Monocytes Absolute: 0.8 10*3/uL (ref 0.1–1.0)
NEUTROS ABS: 2.1 10*3/uL (ref 1.7–7.7)
NEUTROS PCT: 48 %
PLATELETS: 116 10*3/uL — AB (ref 150–400)
RBC: 5.65 MIL/uL (ref 4.22–5.81)
RDW: 13.4 % (ref 11.5–15.5)
WBC: 4.6 10*3/uL (ref 4.0–10.5)
nRBC: 0 % (ref 0.0–0.2)

## 2018-07-14 LAB — POC OCCULT BLOOD, ED: Fecal Occult Bld: NEGATIVE

## 2018-07-14 MED ORDER — ONDANSETRON 4 MG PO TBDP: 4.0000 mg | ORAL_TABLET | Freq: Once | ORAL | Status: DC

## 2018-07-14 MED ORDER — POTASSIUM CHLORIDE 10 MEQ/100ML IV SOLN
10.0000 meq | Freq: Once | INTRAVENOUS | Status: AC
  Administered 2018-07-14: 10 meq via INTRAVENOUS
  Filled 2018-07-14: qty 100

## 2018-07-14 MED ORDER — HYDROCORTISONE ACETATE 25 MG RE SUPP: 25.0000 mg | Freq: Two times a day (BID) | RECTAL | 0 refills | Status: AC

## 2018-07-14 MED ORDER — IOHEXOL 300 MG/ML  SOLN
100.0000 mL | Freq: Once | INTRAMUSCULAR | Status: AC | PRN
  Administered 2018-07-14: 100 mL via INTRAVENOUS

## 2018-07-14 MED ORDER — ONDANSETRON HCL 4 MG/2ML IJ SOLN
4.0000 mg | Freq: Once | INTRAMUSCULAR | Status: AC
  Administered 2018-07-14: 4 mg via INTRAVENOUS
  Filled 2018-07-14: qty 2

## 2018-07-14 MED ORDER — MORPHINE SULFATE (PF) 4 MG/ML IV SOLN
4.0000 mg | Freq: Once | INTRAVENOUS | Status: AC
  Administered 2018-07-14: 4 mg via INTRAVENOUS
  Filled 2018-07-14: qty 1

## 2018-07-14 MED ORDER — ONDANSETRON HCL 4 MG/2ML IJ SOLN: 4.0000 mg | Freq: Once | INTRAMUSCULAR | Status: DC

## 2018-07-14 MED ORDER — DOCUSATE SODIUM 100 MG PO CAPS: 100.0000 mg | ORAL_CAPSULE | Freq: Two times a day (BID) | ORAL | 0 refills | Status: DC

## 2018-07-14 MED ORDER — OXYCODONE-ACETAMINOPHEN 5-325 MG PO TABS: 1.0000 | ORAL_TABLET | Freq: Once | ORAL | Status: DC

## 2018-07-14 MED ORDER — MORPHINE SULFATE (PF) 4 MG/ML IV SOLN: 4.0000 mg | Freq: Once | INTRAVENOUS | Status: DC

## 2018-07-14 MED ORDER — SODIUM CHLORIDE 0.9 % IV BOLUS
1000.0000 mL | Freq: Once | INTRAVENOUS | Status: AC
  Administered 2018-07-14: 1000 mL via INTRAVENOUS

## 2018-07-14 MED ORDER — POTASSIUM CHLORIDE CRYS ER 20 MEQ PO TBCR: 20.0000 meq | EXTENDED_RELEASE_TABLET | Freq: Two times a day (BID) | ORAL | 0 refills | Status: DC

## 2018-07-14 NOTE — ED Notes (Signed)
Pt took home HTN meds

## 2018-07-14 NOTE — ED Triage Notes (Signed)
Pt sent here by Dr. Collene Mares for rectal pain times x 2 weeks. Pt c/o 10/10 pain on exam. No bleeding per pt. States he has been constipated and just started stool softeners 2 days ago. Last BM yesterday

## 2018-07-14 NOTE — ED Provider Notes (Signed)
[facility] EMERGENCY DEPARTMENT Provider Note  CSN: [id number] Arrival date & time: 07/14/18  1528    History   Chief Complaint Chief Complaint  Patient presents with  . Rectal Pain    HPI Mark Villarreal is a 57 y.o. male with a medical history of HTN and hepatic steatosis who presented to the ED for rectal pain. He describes intermittent, aching and cramping pain that has been present for 2 weeks. Endorses feeling dehydrated and constipation which has been treated with stool softeners. Denies fever, chills, melena/hematochezia, abdominal pain, diarrhea, dysuria, penile/scrotal pain or swelling. Patient states he went to his GI provider, Dr. Collene Mares, today and states that she referred him to the ED as she had concerns for an abscess.  HPI  Past Medical History:  Diagnosis Date  . Acute kidney injury ([place])   . Chest pain 06/23/2018  . Cholelithiasis 05/2018  . Hepatic steatosis   . Hypertension     Patient Active Problem List   Diagnosis Date Noted  . Degeneration of cervical intervertebral disc 06/24/2018  . Hypertension   . Hepatic steatosis   . Atypical chest pain 06/23/2018  . Chest pain 06/23/2018  . Cholelithiasis 05/31/2018    Past Surgical History:  Procedure Laterality Date  . WRIST SURGERY Right         Home Medications    Prior to Admission medications   Medication Sig Start Date End Date Taking? Authorizing Provider  ASPIRIN LOW DOSE 81 MG EC tablet Take 81 mg by mouth at bedtime.  06/09/18   [provider]  atorvastatin (LIPITOR) 20 MG tablet Take 20 mg by mouth at bedtime. 06/09/18   [provider]  Azilsartan-Chlorthalidone (EDARBYCLOR PO) Take by mouth.    [provider]  cetirizine (ZYRTEC) 10 MG tablet Take 10 mg by mouth daily. 06/08/18   [provider]  famotidine (PEPCID) 20 MG tablet Take 1 tablet (20 mg total) by mouth 2 (two) times daily. 06/24/18 07/24/18  Radene Gunning, NP    fluticasone (FLONASE) 50 MCG/ACT nasal spray Place 1 spray into both nostrils daily. 06/08/18   [provider]  losartan-hydrochlorothiazide (HYZAAR) 50-12.5 MG tablet Take 1 tablet by mouth at bedtime.  06/08/18   [provider]  simethicone (MYLICON) 80 MG chewable tablet Chew 1 tablet (80 mg total) by mouth 3 (three) times daily. 06/24/18   Black, Lezlie Octave, NP  traMADol (ULTRAM) 50 MG tablet Take 50-100 mg by mouth every 4 (four) hours as needed. 06/04/18   [provider]    Family History No family history on file.  Social History Social History   Tobacco Use  . Smoking status: Never Smoker  . Smokeless tobacco: Never Used  Substance Use Topics  . Alcohol use: Yes  . Drug use: Never     Allergies   Patient has no known allergies.   Review of Systems Review of Systems  Constitutional: Negative for chills and fever.  Gastrointestinal: Positive for constipation and rectal pain. Negative for abdominal pain, anal bleeding, blood in stool, diarrhea and nausea.  Skin:       Dry mouth  All other systems reviewed and are negative.  Physical Exam Updated Vital Signs BP (!) 134/108 (BP Location: Right Arm)   Pulse 100   Temp 99.2 F (37.3 C) (Oral)   Resp 18   Ht 5\' 9"  (1.753 m)   Wt 90.3 kg   SpO2 100%   BMI 29.39 kg/m  Physical Exam  Constitutional: He appears well-developed and well-nourished.  Appears in discomfort.  Cardiovascular: Normal rate, regular rhythm and normal heart sounds.  Pulmonary/Chest: Effort normal and breath sounds normal.  Abdominal: Soft. Bowel sounds are normal. He exhibits no distension. There is no tenderness.  Genitourinary: Testes normal and penis normal. Circumcised.  Genitourinary Comments: No external hemorrhoids or fissures seen. Exquisite pain with palpation of anus and rectum. Rectum exam limited due to pain. Unable to palpate any mass, fluctuance or warmth. No bleeding. Hemoccult negative.   Lymphadenopathy: No inguinal adenopathy noted on the right or left side.  Skin: Skin is warm. Capillary refill takes less than 2 seconds. He is not diaphoretic. No pallor.  Nursing note and vitals reviewed.  ED Treatments / Results  Labs (all labs ordered are listed, but only abnormal results are displayed) Labs Reviewed  CBC WITH DIFFERENTIAL/PLATELET  COMPREHENSIVE METABOLIC PANEL  URINALYSIS, ROUTINE W REFLEX MICROSCOPIC  POC OCCULT BLOOD, ED    EKG None  Radiology No results found.  Procedures Procedures (including critical care time)  Medications Ordered in ED Medications  sodium chloride 0.9 % bolus 1,000 mL (1,000 mLs Intravenous New Bag/Given 07/14/18 1712)  morphine 4 MG/ML injection 4 mg (4 mg Intravenous Given 07/14/18 1715)  ondansetron (ZOFRAN) injection 4 mg (4 mg Intravenous Given 07/14/18 1714)    Initial Impression / Assessment and Plan / ED Course  Triage vital signs and the nursing notes have been reviewed.  Pertinent labs & imaging results that were available during care of the patient were reviewed and considered in medical decision making (see chart for details).  Patient presents to the ED via GI for rectal pain and had concerns for a rectal abscess. Physical exam is consistent with that as patient has had exquistely tender rectum with minimal digit insertion on exam. Will evaluate further with CT imaging. No other physical exam findings to suggest that there is an acute intra-abdominal process occurring.  Clinical Course as of Jul 14 2144  Thu Jul 14, 2018  1727 Hemoccult negative.   [GM]  1932 Hypokalemic at 2.8. No associated changes on EKG. QT normal.   [GM]  1934 CT abd/pelvis shows no acute intra-abdominal findings. No abnormalities with rectum or prostate. Likely internal hemorrhoids given exam findings.   [GM]  2126 UA unremarkable. Not indicative of infection.   [GM]    Clinical Course User Index [GM] Mortis, Jonelle Sports, PA-C    Final Clinical Impressions(s) / ED Diagnoses  1. Rectal Pain. Rx for Anusol and Colace prescribed. Education provided on red flag s/s that warrant sooner medical follow-up. Advised to follow-up with GI for further evaluation. 2. Hypokalemia. Rx for potassium replacement prescribed. Advised to follow-up with PCP for repeat bloodwork.  Dispo: Home. After thorough clinical evaluation, this patient is determined to be medically stable and can be safely discharged with the previously mentioned treatment and/or outpatient follow-up/referral(s). At this time, there are no other apparent medical conditions that require further screening, evaluation or treatment.   Final diagnoses:  Rectal pain  Hypokalemia    ED Discharge Orders         Ordered    hydrocortisone (ANUSOL-HC) 25 MG suppository  2 times daily     07/14/18 2129    docusate sodium (COLACE) 100 MG capsule  Every 12 hours     07/14/18 2129    potassium chloride SA (K-DUR,KLOR-CON) 20 MEQ tablet  2 times daily     07/14/18 2146  Junita Push 07/14/18 2147    Lacretia Leigh, MD 07/18/18 1334

## 2018-07-14 NOTE — Discharge Instructions (Addendum)
Your CT scan today shows that there was no abscess or any other abnormalities in the rectum or prostate. It is possible that you have internal hemorrhoids. Please call Dr. Lorie Apley office to schedule a follow-up appointment. I have prescribed you some suppositories (Anusol) to help with the discomfort as well as a stool softener (Colace). You can continue doing warm soaks to help with the pain as well.  Today your potassium level was low. I have prescribed you supplements to take daily to increase it. Please follow-up with your PCP to get repeat bloodwork in about a month.  It was good seeing you today (not under the best circumstances). Hopefully next time I see you, it will be on the court. Take care of yourself!

## 2018-07-15 ENCOUNTER — Other Ambulatory Visit: Payer: Self-pay | Admitting: Gastroenterology

## 2018-07-15 ENCOUNTER — Ambulatory Visit (HOSPITAL_COMMUNITY)
Admission: RE | Admit: 2018-07-15 | Discharge: 2018-07-15 | Disposition: A | Discharge status: Home / Self Care | Payer: 59 | Source: Ambulatory Visit | Attending: Gastroenterology | Admitting: Gastroenterology

## 2018-07-15 DIAGNOSIS — K6289 Other specified diseases of anus and rectum: Secondary | ICD-10-CM

## 2018-07-15 MED ORDER — GADOBUTROL 1 MMOL/ML IV SOLN
9.0000 mL | Freq: Once | INTRAVENOUS | Status: AC | PRN
  Administered 2018-07-15: 9 mL via INTRAVENOUS

## 2018-07-20 DIAGNOSIS — I10 Essential (primary) hypertension: Secondary | ICD-10-CM | POA: Diagnosis not present

## 2018-07-20 DIAGNOSIS — K5900 Constipation, unspecified: Secondary | ICD-10-CM | POA: Diagnosis not present

## 2018-07-26 DIAGNOSIS — Z1211 Encounter for screening for malignant neoplasm of colon: Secondary | ICD-10-CM | POA: Diagnosis not present

## 2018-07-26 DIAGNOSIS — K601 Chronic anal fissure: Secondary | ICD-10-CM | POA: Diagnosis not present

## 2018-07-26 DIAGNOSIS — K219 Gastro-esophageal reflux disease without esophagitis: Secondary | ICD-10-CM | POA: Diagnosis not present

## 2018-08-16 DIAGNOSIS — I10 Essential (primary) hypertension: Secondary | ICD-10-CM | POA: Diagnosis not present

## 2018-08-17 DIAGNOSIS — K6289 Other specified diseases of anus and rectum: Secondary | ICD-10-CM | POA: Diagnosis not present

## 2018-08-17 DIAGNOSIS — K621 Rectal polyp: Secondary | ICD-10-CM | POA: Diagnosis not present

## 2018-08-17 DIAGNOSIS — D128 Benign neoplasm of rectum: Secondary | ICD-10-CM | POA: Diagnosis not present

## 2018-08-17 DIAGNOSIS — Z1211 Encounter for screening for malignant neoplasm of colon: Secondary | ICD-10-CM | POA: Diagnosis not present

## 2018-08-17 DIAGNOSIS — K635 Polyp of colon: Secondary | ICD-10-CM | POA: Diagnosis not present

## 2018-08-17 DIAGNOSIS — D123 Benign neoplasm of transverse colon: Secondary | ICD-10-CM | POA: Diagnosis not present

## 2018-08-23 DIAGNOSIS — K601 Chronic anal fissure: Secondary | ICD-10-CM | POA: Diagnosis not present

## 2018-08-23 DIAGNOSIS — K625 Hemorrhage of anus and rectum: Secondary | ICD-10-CM | POA: Diagnosis not present

## 2018-08-23 DIAGNOSIS — K5904 Chronic idiopathic constipation: Secondary | ICD-10-CM | POA: Diagnosis not present

## 2018-09-19 DIAGNOSIS — I10 Essential (primary) hypertension: Secondary | ICD-10-CM | POA: Diagnosis not present

## 2019-01-14 DIAGNOSIS — D72818 Other decreased white blood cell count: Secondary | ICD-10-CM | POA: Diagnosis not present

## 2019-01-14 DIAGNOSIS — I10 Essential (primary) hypertension: Secondary | ICD-10-CM | POA: Diagnosis not present

## 2019-01-14 DIAGNOSIS — R945 Abnormal results of liver function studies: Secondary | ICD-10-CM | POA: Diagnosis not present

## 2019-01-14 DIAGNOSIS — Z125 Encounter for screening for malignant neoplasm of prostate: Secondary | ICD-10-CM | POA: Diagnosis not present

## 2019-01-14 DIAGNOSIS — K219 Gastro-esophageal reflux disease without esophagitis: Secondary | ICD-10-CM | POA: Diagnosis not present

## 2019-11-10 ENCOUNTER — Other Ambulatory Visit: Payer: Self-pay | Admitting: Family Medicine

## 2019-11-10 ENCOUNTER — Ambulatory Visit
Admission: RE | Admit: 2019-11-10 | Discharge: 2019-11-10 | Disposition: A | Discharge status: Home / Self Care | Payer: 59 | Source: Ambulatory Visit | Attending: Family Medicine | Admitting: Family Medicine

## 2019-11-10 DIAGNOSIS — R52 Pain, unspecified: Secondary | ICD-10-CM

## 2019-11-13 ENCOUNTER — Telehealth: Payer: Self-pay | Admitting: Hematology

## 2019-11-13 NOTE — Telephone Encounter (Signed)
Scheduled per referral. Called and spoke with pt, confirmed 4/8 appt. Pt made aware to arrive 15-30 mins early and to bring insurance card with photo ID

## 2019-11-23 ENCOUNTER — Other Ambulatory Visit: Payer: Self-pay | Admitting: Gastroenterology

## 2019-11-23 DIAGNOSIS — R1012 Left upper quadrant pain: Secondary | ICD-10-CM

## 2019-11-23 DIAGNOSIS — R1013 Epigastric pain: Secondary | ICD-10-CM

## 2019-11-23 DIAGNOSIS — R112 Nausea with vomiting, unspecified: Secondary | ICD-10-CM

## 2019-12-06 NOTE — Progress Notes (Signed)
HEMATOLOGY/ONCOLOGY CONSULTATION NOTE  Date of Service: 12/07/2019  Patient Care Team: Lucianne Lei, MD as PCP - General (Family Medicine)  REFERRING PHYSICIAN: Lucianne Lei, MD  CHIEF COMPLAINTS/PURPOSE OF CONSULTATION:  Thrombocytopenia    HISTORY OF PRESENTING ILLNESS:   Mark Villarreal is a wonderful 59 y.o. male who has been referred to [place] by Nelwyn Salisbury, MD for evaluation and management of Thrombocytopenia. The pt reports that he is doing well overall.   The pt reports he is good.  He has been taking amlodipine, aspirin, VSL 3 Probiotic and Ultraflora Balance Probiotic. Prior to anal fissure pt was taking acid suppressants, but they were making him constipated, super thirsty and have low appetite. He has been eating lots of banana's to keep his potassium up. Pt drinks about 1 glass of wine a night and a couple of beers on weekends. He as lost about 10 pounds over the last month or so. Pt has been walking and biking.   Most recent lab results (10/26/19) of CBC is as follows: all values are WNL except for WBC at 3.1, Platelets at 130, Glucose at 100, ALt at 133, AST at 77.  On review of systems, pt reports denies lower abdominal pains, pedal edema, pain along spine and any other symptoms.   On PMHx the pt reports concussion in 2012, fatty liver in 2019, gallstones, acid reflux, right hand tendon surgery in 1990's, left hand arthritis, high blood pressure, anal fissure, osteoarthritis in left knee   On Social Hx the pt reports some alcohol use, no smoking   MEDICAL HISTORY:  Past Medical History:  Diagnosis Date  . Acute kidney injury ([place])   . Chest pain 06/23/2018  . Cholelithiasis 05/2018  . Hepatic steatosis   . Hypertension      SURGICAL HISTORY: Past Surgical History:  Procedure Laterality Date  . WRIST SURGERY Right      SOCIAL HISTORY: Social History   Socioeconomic History  . Marital status: Married    Spouse name: Not on file  . Number of  children: Not on file  . Years of education: Not on file  . Highest education level: Not on file  Occupational History  . Not on file  Tobacco Use  . Smoking status: Never Smoker  . Smokeless tobacco: Never Used  Substance and Sexual Activity  . Alcohol use: Yes  . Drug use: Never  . Sexual activity: Not on file  Other Topics Concern  . Not on file  Social History Narrative  . Not on file   Social Determinants of Health   Financial Resource Strain:   . Difficulty of Paying Living Expenses:   Food Insecurity:   . Worried About Charity fundraiser in the Last Year:   . Arboriculturist in the Last Year:   Transportation Needs:   . Film/video editor (Medical):   Marland Kitchen Lack of Transportation (Non-Medical):   Physical Activity:   . Days of Exercise per Week:   . Minutes of Exercise per Session:   Stress:   . Feeling of Stress :   Social Connections:   . Frequency of Communication with Friends and Family:   . Frequency of Social Gatherings with Friends and Family:   . Attends Religious Services:   . Active Member of Clubs or Organizations:   . Attends Archivist Meetings:   Marland Kitchen Marital Status:   Intimate Partner Violence:   . Fear of Current or Ex-Partner:   .  Emotionally Abused:   Marland Kitchen Physically Abused:   . Sexually Abused:      FAMILY HISTORY: No family history on file.   ALLERGIES:   has No Known Allergies.   MEDICATIONS:  Current Outpatient Medications  Medication Sig Dispense Refill  . amLODipine (NORVASC) 10 MG tablet Take 10 mg by mouth daily.  3  . ASPIRIN LOW DOSE 81 MG EC tablet Take 81 mg by mouth at bedtime.   3  . Bacillus Coagulans-Inulin (PROBIOTIC FORMULA PO) Take by mouth. VSL 3 Probiotic - prescribed by Dr.Mann    . Probiotic Product (PROBIOTIC DAILY) CAPS Take by mouth. Ultra Flora Probiotic - prescribed by Dr. Collene Mares    . famotidine (PEPCID) 20 MG tablet Take 1 tablet (20 mg total) by mouth 2 (two) times daily. 60 tablet 1  .  fluticasone (FLONASE) 50 MCG/ACT nasal spray Place 1 spray into both nostrils daily.  3  . hydrochlorothiazide (HYDRODIURIL) 25 MG tablet Take 25 mg by mouth daily.  3  . potassium chloride SA (K-DUR,KLOR-CON) 20 MEQ tablet Take 1 tablet (20 mEq total) by mouth 2 (two) times daily. 60 tablet 0   No current facility-administered medications for this visit.     REVIEW OF SYSTEMS:   A 10+ POINT REVIEW OF SYSTEMS WAS OBTAINED including neurology, dermatology, psychiatry, cardiac, respiratory, lymph, extremities, GI, GU, Musculoskeletal, constitutional, breasts, reproductive, HEENT.  All pertinent positives are noted in the HPI.  All others are negative.    PHYSICAL EXAMINATION: ECOG PERFORMANCE STATUS: 1 - Symptomatic but completely ambulatory  Vitals:   12/07/19 1109  BP: (!) 142/85  Pulse: 82  Resp: 18  Temp: 98.5 F (36.9 C)  SpO2: 100%   Filed Weights   12/07/19 1109  Weight: 199 lb 8 oz (90.5 kg)   Body mass index is 29.46 kg/m.  GENERAL:alert, in no acute distress and comfortable SKIN: no acute rashes, no significant lesions EYES: conjunctiva are pink and non-injected, sclera anicteric OROPHARYNX: MMM, no exudates, no oropharyngeal erythema or ulceration NECK: supple, no JVD LYMPH:  no palpable lymphadenopathy in the cervical, axillary or inguinal regions LUNGS: clear to auscultation b/l with normal respiratory effort HEART: regular rate & rhythm ABDOMEN:  normoactive bowel sounds , non tender, not distended. Extremity: no pedal edema PSYCH: alert & oriented x 3 with fluent speech NEURO: no focal motor/sensory deficits   LABORATORY DATA:  I have reviewed the data as listed  CBC Latest Ref Rng & Units 12/07/2019 07/14/2018 06/24/2018  WBC 4.0 - 10.5 K/uL 2.9(L) 4.6 3.3(L)  Hemoglobin 13.0 - 17.0 g/dL 14.2 15.1 12.7(L)  Hematocrit 39.0 - 52.0 % 45.1 47.2 40.9  Platelets 150 - 400 K/uL 128(L) 116(L) 95(L)   ANC 1.2k . CBC    Component Value Date/Time   WBC 2.9  (L) 12/07/2019 1221   RBC 5.18 12/07/2019 1221   HGB 14.2 12/07/2019 1221   HCT 45.1 12/07/2019 1221   PLT 128 (L) 12/07/2019 1221   MCV 87.1 12/07/2019 1221   MCH 27.4 12/07/2019 1221   MCHC 31.5 12/07/2019 1221   RDW 14.6 12/07/2019 1221   LYMPHSABS 1.2 12/07/2019 1221   MONOABS 0.4 12/07/2019 1221   EOSABS 0.0 12/07/2019 1221   BASOSABS 0.0 12/07/2019 1221     CMP Latest Ref Rng & Units 12/07/2019 07/14/2018 06/24/2018  Glucose 70 - 99 mg/dL 102(H) 96 95  BUN 6 - 20 mg/dL 9 21(H) 10  Creatinine 0.61 - 1.24 mg/dL 0.95 1.42(H) 1.14  Sodium 135 -  145 mmol/L 140 136 139  Potassium 3.5 - 5.1 mmol/L 3.6 2.8(L) 3.9  Chloride 98 - 111 mmol/L 107 98 106  CO2 22 - 32 mmol/L 27 25 26   Calcium 8.9 - 10.3 mg/dL 9.0 9.3 8.7(L)  Total Protein 6.5 - 8.1 g/dL 7.1 6.6 5.5(L)  Total Bilirubin 0.3 - 1.2 mg/dL 0.7 1.8(H) 1.2  Alkaline Phos 38 - 126 U/L 79 45 45  AST 15 - 41 U/L 98(H) 85(H) 76(H)  ALT 0 - 44 U/L 138(H) 177(H) 148(H)   Component     Latest Ref Rng & Units 12/07/2019  Hepatitis B Surface Ag     NON REACTIVE NON REACTIVE  Hep B Core Total Ab     NON REACTIVE NON REACTIVE  HCV Ab     NON REACTIVE Reactive (A)  Platelet CT in Citrate      EDTA platelet count consistent with citrate.  Immature Platelet Fraction     1.2 - 8.6 % 2.7  Vitamin B12     180 - 914 pg/mL 375     RADIOGRAPHIC STUDIES: I have personally reviewed the radiological images as listed and agreed with the findings in the report. DG Wrist Complete Left  Result Date: 11/10/2019 CLINICAL DATA:  Pain in the hand wrist for 2-3 months, no injury, no surgery EXAM: LEFT WRIST - COMPLETE 3+ VIEW; LEFT HAND - COMPLETE 3+ VIEW COMPARISON:  None. FINDINGS: No fracture or dislocation of the left hand or left wrist. Generally mild osteoarthritic pattern degenerative change throughout the hand and wrist, focally moderate at the thumb interphalangeal joint and first carpometacarpal joint. There is widening of the scapholunate  interval to approximately 6 mm with mild associated radioscaphoid arthrosis. Soft tissues are unremarkable. IMPRESSION: 1. No acute fracture or dislocation of the left hand or wrist. 2. Widening of the scapholunate interval to approximately 6 mm with mild associated radioscaphoid arthrosis. 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 3. Generally mild osteoarthritic pattern degenerative change throughout the hand and wrist, focally moderate at the thumb interphalangeal joint and first carpometacarpal joint. Electronically Signed   By: Eddie Candle M.D.   On: 11/10/2019 10:28   DG Hand Complete Left  Result Date: 11/10/2019 CLINICAL DATA:  Pain in the hand wrist for 2-3 months, no injury, no surgery EXAM: LEFT WRIST - COMPLETE 3+ VIEW; LEFT HAND - COMPLETE 3+ VIEW COMPARISON:  None. FINDINGS: No fracture or dislocation of the left hand or left wrist. Generally mild osteoarthritic pattern degenerative change throughout the hand and wrist, focally moderate at the thumb interphalangeal joint and first carpometacarpal joint. There is widening of the scapholunate interval to approximately 6 mm with mild associated radioscaphoid arthrosis. Soft tissues are unremarkable. IMPRESSION: 1. No acute fracture or dislocation of the left hand or wrist. 2. Widening of the scapholunate interval to approximately 6 mm with mild associated radioscaphoid arthrosis. 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 3. Generally mild osteoarthritic pattern degenerative change throughout the hand and wrist, focally moderate at the thumb interphalangeal joint and first carpometacarpal joint. Electronically Signed   By: Eddie Candle M.D.   On: 11/10/2019 10:28     ASSESSMENT & PLAN:  DIO BETHELL is a 59 y.o. male with:  1. Mild thrombocytopenia PLT 128k 2. Mild neutropenia ANC 1.2k 3. Abnormal LFts - Hep C Ab +ve concerning for newly diagnosed  Hepatitis C  If Hep C and possibly hep C related chronic disease confirmed  this would likely be etiology of thrombocytopenia and neutropenia   PLAN: -Discussed patient's most recent labs from 10/26/19, of CBC is as follows: all values are WNL except for WBC at 3.1, Platelets at 130, Glucose at 100, ALt at 133, AST at 77. -Platelets counts low at 95 in 2019- have improved since them -Educated on normal platelet counts and pt's history of low platelets -Educated on risk of increase bleeding with an without injury  -Advised platelets are due to chronic liver diseas e? Etiology ?fatty liver (labs done showed Hep C +ve) -Advised on healthy foods to eat  -Recommended that the pt continue to eat well, drink at least 48-64 oz of water each day, and walk 20-30 minutes each day.  -Recommends watching alcohol consumption  -Recommends f/u with Dr. Collene Mares for fatty liver  -Will get labs today   FOLLOW UP Labs today RTC with Dr Irene Limbo based on labs  . Orders Placed This Encounter  Procedures  . CBC with Differential/Platelet    Standing Status:   Future    Number of Occurrences:   1    Standing Expiration Date:   01/10/2021  . CMP ([place] only)    Standing Status:   Future    Number of Occurrences:   1    Standing Expiration Date:   12/06/2020  . Vitamin B12    Standing Status:   Future    Number of Occurrences:   1    Standing Expiration Date:   12/06/2020  . Immature Platelet Fraction    Standing Status:   Future    Number of Occurrences:   1    Standing Expiration Date:   12/06/2020  . Platelet by Citrate    Standing Status:   Future    Number of Occurrences:   1    Standing Expiration Date:   12/06/2020  . Hepatitis C antibody    Standing Status:   Future    Number of Occurrences:   1    Standing Expiration Date:   12/06/2020  . Hepatitis B core antibody, total    Standing Status:   Future    Number of Occurrences:   1    Standing Expiration Date:   12/06/2020  . Hepatitis B surface  antigen    Standing Status:   Future    Number of Occurrences:   1    Standing Expiration Date:   12/06/2020    The total time spent in the appt was 60 minutes and more than 50% was on counseling and direct patient cares.  All of the patient's questions were answered with apparent satisfaction. The patient knows to call the clinic with any problems, questions or concerns.     Sullivan Lone MD MS AAHIVMS [facility] Hematology/Oncology Physician [facility]  (Office):       [phone] (Work cell):  [phone] (Fax):           [phone]  12/07/2019 12:18 PM  I, Dawayne Cirri am acting as a Education administrator for Dr. Sullivan Lone.   .I have reviewed the above documentation for accuracy and completeness, and I agree with the above. Brunetta Genera MD

## 2019-12-07 ENCOUNTER — Other Ambulatory Visit: Payer: Self-pay

## 2019-12-07 ENCOUNTER — Inpatient Hospital Stay: Payer: 59 | Attending: Hematology | Admitting: Hematology

## 2019-12-07 ENCOUNTER — Inpatient Hospital Stay: Payer: 59

## 2019-12-07 ENCOUNTER — Telehealth: Payer: Self-pay | Admitting: Hematology

## 2019-12-07 VITALS — BP 142/85 | HR 82 | Temp 98.5°F | Resp 18 | Ht 69.0 in | Wt 199.5 lb

## 2019-12-07 DIAGNOSIS — I10 Essential (primary) hypertension: Secondary | ICD-10-CM | POA: Diagnosis not present

## 2019-12-07 DIAGNOSIS — D709 Neutropenia, unspecified: Secondary | ICD-10-CM | POA: Insufficient documentation

## 2019-12-07 DIAGNOSIS — R7989 Other specified abnormal findings of blood chemistry: Secondary | ICD-10-CM | POA: Insufficient documentation

## 2019-12-07 DIAGNOSIS — Z7289 Other problems related to lifestyle: Secondary | ICD-10-CM | POA: Diagnosis not present

## 2019-12-07 DIAGNOSIS — D696 Thrombocytopenia, unspecified: Secondary | ICD-10-CM | POA: Insufficient documentation

## 2019-12-07 LAB — CMP (CANCER CENTER ONLY)
ALT: 138 U/L — ABNORMAL HIGH (ref 0–44)
AST: 98 U/L — ABNORMAL HIGH (ref 15–41)
Albumin: 3.5 g/dL (ref 3.5–5.0)
Alkaline Phosphatase: 79 U/L (ref 38–126)
Anion gap: 6 (ref 5–15)
BUN: 9 mg/dL (ref 6–20)
CO2: 27 mmol/L (ref 22–32)
Calcium: 9 mg/dL (ref 8.9–10.3)
Chloride: 107 mmol/L (ref 98–111)
Creatinine: 0.95 mg/dL (ref 0.61–1.24)
GFR, Est AFR Am: >60 mL/min (ref >60)
GFR, Estimated: >60 mL/min (ref >60)
Glucose, Bld: 102 mg/dL — ABNORMAL HIGH (ref 70–99)
Potassium: 3.6 mmol/L (ref 3.5–5.1)
Sodium: 140 mmol/L (ref 135–145)
Total Bilirubin: 0.7 mg/dL (ref 0.3–1.2)
Total Protein: 7.1 g/dL (ref 6.5–8.1)

## 2019-12-07 LAB — CBC WITH DIFFERENTIAL/PLATELET
Abs Immature Granulocytes: 0.01 10*3/uL (ref 0.00–0.07)
Basophils Absolute: 0 10*3/uL (ref 0.0–0.1)
Basophils Relative: 1 %
Eosinophils Absolute: 0 10*3/uL (ref 0.0–0.5)
Eosinophils Relative: 1 %
HCT: 45.1 % (ref 39.0–52.0)
Hemoglobin: 14.2 g/dL (ref 13.0–17.0)
Immature Granulocytes: 0 %
Lymphocytes Relative: 41 %
Lymphs Abs: 1.2 10*3/uL (ref 0.7–4.0)
MCH: 27.4 pg (ref 26.0–34.0)
MCHC: 31.5 g/dL (ref 30.0–36.0)
MCV: 87.1 fL (ref 80.0–100.0)
Monocytes Absolute: 0.4 10*3/uL (ref 0.1–1.0)
Monocytes Relative: 14 %
Neutro Abs: 1.2 10*3/uL — ABNORMAL LOW (ref 1.7–7.7)
Neutrophils Relative %: 43 %
Platelets: 128 10*3/uL — ABNORMAL LOW (ref 150–400)
RBC: 5.18 MIL/uL (ref 4.22–5.81)
RDW: 14.6 % (ref 11.5–15.5)
WBC: 2.9 10*3/uL — ABNORMAL LOW (ref 4.0–10.5)
nRBC: 0 % (ref 0.0–0.2)

## 2019-12-07 LAB — HEPATITIS C ANTIBODY: HCV Ab: REACTIVE — AB

## 2019-12-07 LAB — VITAMIN B12: Vitamin B-12: 375 pg/mL (ref 180–914)

## 2019-12-07 LAB — PLATELET BY CITRATE

## 2019-12-07 LAB — HEPATITIS B CORE ANTIBODY, TOTAL: Hep B Core Total Ab: NONREACTIVE

## 2019-12-07 LAB — HEPATITIS B SURFACE ANTIGEN: Hepatitis B Surface Ag: NONREACTIVE

## 2019-12-07 LAB — IMMATURE PLATELET FRACTION: Immature Platelet Fraction: 2.7 % (ref 1.2–8.6)

## 2019-12-07 NOTE — Telephone Encounter (Signed)
Per 4/8 los Labs today RTC with Dr Irene Limbo based on labs

## 2019-12-07 NOTE — Patient Instructions (Signed)
Thank you for choosing [facility] to provide your oncology and hematology care.   Should you have questions after your visit to the [facility] ([facility]), please contact this office at [phone] between 8:30 AM and 4:30 PM.  Voice mails left after 4:00 PM may not be returned until the following business day.  Calls received after 4:30 PM will be answered by an off-site Nurse Triage Line.    Prescription Refills:  Please have your pharmacy contact us directly for most prescription requests.  Contact the office directly for refills of narcotics (pain medications). Allow 48-72 hours for refills.  Appointments: Please contact the [facility] scheduling department [phone] for questions regarding [facility] appointment scheduling.  Contact the schedulers with any scheduling changes so that your appointment can be rescheduled in a timely manner.   Central Scheduling for [place] [phone] - Call to schedule procedures such as PET scans, CT scans, MRI, Ultrasound, etc.  To afford each patient quality time with our providers, please arrive 30 minutes before your scheduled appointment time.  If you arrive late for your appointment, you may be asked to reschedule.  We strive to give you quality time with our providers, and arriving late affects you and other patients whose appointments are after yours. If you are a no show for multiple scheduled visits, you may be dismissed from the clinic at the providers discretion.     Resources: [facility] Social Workers [phone] for additional information on assistance programs or assistance connecting with community support programs   [place] DSS  336-641-3447: Information regarding food stamps, Medicaid, and utility assistance [place] 336-333-6589   [place] [employer]'s shared-ride transportation service for eligible riders who have a disability that prevents them from riding the fixed route bus.   Medicare Rights Center  800-333-4114 Helps people with Medicare understand their rights and benefits, navigate the Medicare system, and secure the quality healthcare they deserve [employer] 800-227-2345 Assists patients locate various types of support and financial assistance Cancer Care: 1-800-813-HOPE (4673) Provides financial assistance, online support groups, medication/co-pay assistance.   Transportation Assistance for appointments at [facility]: Transportation Coordinator [phone]  Again, thank you for choosing [facility] for your care.       

## 2019-12-12 ENCOUNTER — Ambulatory Visit
Admission: RE | Admit: 2019-12-12 | Discharge: 2019-12-12 | Disposition: A | Discharge status: Home / Self Care | Payer: 59 | Source: Ambulatory Visit | Attending: Gastroenterology | Admitting: Gastroenterology

## 2019-12-12 ENCOUNTER — Other Ambulatory Visit: Payer: Self-pay

## 2019-12-12 DIAGNOSIS — R1013 Epigastric pain: Secondary | ICD-10-CM

## 2019-12-12 DIAGNOSIS — R1012 Left upper quadrant pain: Secondary | ICD-10-CM

## 2019-12-12 DIAGNOSIS — R112 Nausea with vomiting, unspecified: Secondary | ICD-10-CM

## 2019-12-12 MED ORDER — IOPAMIDOL (ISOVUE-300) INJECTION 61%
100.0000 mL | Freq: Once | INTRAVENOUS | Status: AC | PRN
  Administered 2019-12-12: 12:00:00 100 mL via INTRAVENOUS

## 2019-12-13 ENCOUNTER — Other Ambulatory Visit: Payer: Self-pay | Admitting: Hematology

## 2019-12-13 DIAGNOSIS — D696 Thrombocytopenia, unspecified: Secondary | ICD-10-CM

## 2019-12-13 DIAGNOSIS — B1920 Unspecified viral hepatitis C without hepatic coma: Secondary | ICD-10-CM

## 2019-12-13 DIAGNOSIS — D709 Neutropenia, unspecified: Secondary | ICD-10-CM

## 2019-12-13 NOTE — Progress Notes (Signed)
Component     Latest Ref Rng & Units 12/07/2019  WBC     4.0 - 10.5 K/uL 2.9 (L)  RBC     4.22 - 5.81 MIL/uL 5.18  Hemoglobin     13.0 - 17.0 g/dL 14.2  HCT     39.0 - 52.0 % 45.1  MCV     80.0 - 100.0 fL 87.1  MCH     26.0 - 34.0 pg 27.4  MCHC     30.0 - 36.0 g/dL 31.5  RDW     11.5 - 15.5 % 14.6  Platelets     150 - 400 K/uL 128 (L)  nRBC     0.0 - 0.2 % 0.0  Neutrophils     % 43  NEUT#     1.7 - 7.7 K/uL 1.2 (L)  Lymphocytes     % 41  Lymphocyte #     0.7 - 4.0 K/uL 1.2  Monocytes Relative     % 14  Monocyte #     0.1 - 1.0 K/uL 0.4  Eosinophil     % 1  Eosinophils Absolute     0.0 - 0.5 K/uL 0.0  Basophil     % 1  Basophils Absolute     0.0 - 0.1 K/uL 0.0  Immature Granulocytes     % 0  Abs Immature Granulocytes     0.00 - 0.07 K/uL 0.01  Sodium     135 - 145 mmol/L 140  Potassium     3.5 - 5.1 mmol/L 3.6  Chloride     98 - 111 mmol/L 107  CO2     22 - 32 mmol/L 27  Glucose     70 - 99 mg/dL 102 (H)  BUN     6 - 20 mg/dL 9  Creatinine     0.61 - 1.24 mg/dL 0.95  Calcium     8.9 - 10.3 mg/dL 9.0  Total Protein     6.5 - 8.1 g/dL 7.1  Albumin     3.5 - 5.0 g/dL 3.5  AST     15 - 41 U/L 98 (H)  ALT     0 - 44 U/L 138 (H)  Alkaline Phosphatase     38 - 126 U/L 79  Total Bilirubin     0.3 - 1.2 mg/dL 0.7  GFR, Est Non African American     >60 mL/min >60  GFR, Est African American     >60 mL/min >60  Anion gap     5 - 15 6  Hepatitis B Surface Ag     NON REACTIVE NON REACTIVE  Hep B Core Total Ab     NON REACTIVE NON REACTIVE  HCV Ab     NON REACTIVE Reactive (A)  Platelet CT in Citrate      EDTA platelet count consistent with citrate.  Immature Platelet Fraction     1.2 - 8.6 % 2.7  Vitamin B12     180 - 914 pg/mL 375    Hep C +ve -- will refer to Hep C clinic  -will need Hep C VL and genotype testing and CT abd -- will defer to Hep C clinic/PCP _RTC with Dr Irene Limbo with labs in 6 months  .[place]

## 2019-12-14 ENCOUNTER — Telehealth: Payer: Self-pay | Admitting: Hematology

## 2019-12-14 NOTE — Telephone Encounter (Signed)
Scheduled appt per 4/15 sch msg - pt is aware of appt

## 2019-12-15 ENCOUNTER — Telehealth: Payer: Self-pay | Admitting: *Deleted

## 2019-12-15 NOTE — Telephone Encounter (Signed)
Contacted patient regarding test results per Dr. Grier Mitts directions in message below. Patient verbalizes understanding and states Infectious Disease Clinic has contact him--he has an appointment next Friday

## 2019-12-15 NOTE — Telephone Encounter (Signed)
-----   Message from Brunetta Genera, MD sent at 12/14/2019 12:07 AM EDT ----- Plz let patient know platelet counts still about the same @ 128k and WBC mildly low. Noted to be Hepatitis C antibody positive -- would recommend further evaluation likely Hep C with Hep C clinic. Low platelets and abnormal Liver functions likely from hep C related liver disease.

## 2019-12-22 ENCOUNTER — Telehealth: Payer: Self-pay | Admitting: Pharmacy Technician

## 2019-12-22 ENCOUNTER — Encounter: Payer: Self-pay | Admitting: Family

## 2019-12-22 ENCOUNTER — Ambulatory Visit (INDEPENDENT_AMBULATORY_CARE_PROVIDER_SITE_OTHER): Payer: 59 | Admitting: Family

## 2019-12-22 ENCOUNTER — Other Ambulatory Visit: Payer: Self-pay

## 2019-12-22 VITALS — BP 150/87 | HR 69 | Temp 98.5°F

## 2019-12-22 DIAGNOSIS — R768 Other specified abnormal immunological findings in serum: Secondary | ICD-10-CM

## 2019-12-22 NOTE — Telephone Encounter (Signed)
[facility] Patient Advocate Encounter    Findings of the benefits investigation:   Insurance: [employer]- active commerical  Estimated copay amount: $5.00 (with manufacturer coupon)  Prior Authorization: will begin insurance process once medication is prescribed. Appears they are allowing it to be filled at [facility] and not [employer].  The patient prefers to have the medication mailed to his home address. He provided me the updated phone number and address. We will reach out to him once we get approval from the insurance company, he is aware of estimated timeline for labs and insurance.   Mark Villarreal, CPhT Specialty Pharmacy Patient [facility] for Infect[facility] Phone: [phone] Fax: [phone] 12/22/2019 11:51 AM

## 2019-12-22 NOTE — Progress Notes (Signed)
Subjective:    Patient ID: Mark Villarreal, male    DOB: 1960/10/20, 59 y.o.   MRN: [id number]  Chief Complaint  Patient presents with  . Hepatitis C    HPI:  Mark Villarreal is a 59 y.o. male with previous medical history of hypertension, hepatic steatosis, and cholelithiasis presenting today for initial evaluation and treatment of Hepatitis C.  Mark Villarreal was initially diagnosed with positive Hepatitis C antibody test at the beginning of the month during evaluation by GI for bloating and acid reflux. No previous history of IVDU, blood transfusions, sharing of razors/toothbrushes, or sexual contact with know positive partner. Has not been treated to date. No abdominal pain, nausea, scleral icterus, or jaundice. Does continue to have bloating at times. No family or personal history of liver disease. No current use of recreational or illicit  Drugs, tobacco, and drinks alcohol socially.   Lab work reviewed which includes positive hepatitis C antibody test. Recent CT abdomen/pelvis with multiple gall stones and suspected hepatic cysts in the right hepatic lobe measureing 1.2 cm.    No Known Allergies    Outpatient Medications Prior to Visit  Medication Sig Dispense Refill  . amLODipine (NORVASC) 10 MG tablet Take 10 mg by mouth daily.  3  . ASPIRIN LOW DOSE 81 MG EC tablet Take 81 mg by mouth at bedtime.   3  . nebivolol (BYSTOLIC) 10 MG tablet Take 10 mg by mouth daily.    . Probiotic Product (PROBIOTIC DAILY) CAPS Take by mouth. Ultra Flora Probiotic - prescribed by Dr. Collene Mares    . Bacillus Coagulans-Inulin (PROBIOTIC FORMULA PO) Take by mouth. VSL 3 Probiotic - prescribed by Dr.Mann    . famotidine (PEPCID) 20 MG tablet Take 1 tablet (20 mg total) by mouth 2 (two) times daily. 60 tablet 1  . fluticasone (FLONASE) 50 MCG/ACT nasal spray Place 1 spray into both nostrils daily.  3  . hydrochlorothiazide (HYDRODIURIL) 25 MG tablet Take 25 mg by mouth daily.  3  . potassium  chloride SA (K-DUR,KLOR-CON) 20 MEQ tablet Take 1 tablet (20 mEq total) by mouth 2 (two) times daily. 60 tablet 0   No facility-administered medications prior to visit.     Past Medical History:  Diagnosis Date  . Acute kidney injury ([place])   . Chest pain 06/23/2018  . Cholelithiasis 05/2018  . Hepatic steatosis   . Hypertension       Past Surgical History:  Procedure Laterality Date  . WRIST SURGERY Right     History reviewed. No pertinent family history.    Social History   Socioeconomic History  . Marital status: Married    Spouse name: Not on file  . Number of children: Not on file  . Years of education: Not on file  . Highest education level: Not on file  Occupational History  . Not on file  Tobacco Use  . Smoking status: Never Smoker  . Smokeless tobacco: Never Used  Substance and Sexual Activity  . Alcohol use: Yes    Comment: socially  . Drug use: Never  . Sexual activity: Not on file  Other Topics Concern  . Not on file  Social History Narrative  . Not on file   Social Determinants of Health   Financial Resource Strain:   . Difficulty of Paying Living Expenses:   Food Insecurity:   . Worried About Charity fundraiser in the Last Year:   . [place] in the Last  Year:   Transportation Needs:   . Film/video editor (Medical):   Marland Kitchen Lack of Transportation (Non-Medical):   Physical Activity:   . Days of Exercise per Week:   . Minutes of Exercise per Session:   Stress:   . Feeling of Stress :   Social Connections:   . Frequency of Communication with Friends and Family:   . Frequency of Social Gatherings with Friends and Family:   . Attends Religious Services:   . Active Member of Clubs or Organizations:   . Attends Archivist Meetings:   Marland Kitchen Marital Status:   Intimate Partner Violence:   . Fear of Current or Ex-Partner:   . Emotionally Abused:   Marland Kitchen Physically Abused:   . Sexually Abused:     Review of Systems    Constitutional: Negative for chills, diaphoresis, fatigue and fever.  Respiratory: Negative for cough, chest tightness, shortness of breath and wheezing.   Cardiovascular: Negative for chest pain.  Gastrointestinal: Negative for abdominal distention, abdominal pain, constipation, diarrhea, nausea and vomiting.  Neurological: Negative for weakness and headaches.  Hematological: Does not bruise/bleed easily.       Objective:    BP (!) 150/87   Pulse 69   Temp 98.5 F (36.9 C) (Oral)  Nursing note and vital signs reviewed.  Physical Exam Constitutional:      General: He is not in acute distress.    Appearance: He is well-developed.  Cardiovascular:     Rate and Rhythm: Normal rate and regular rhythm.     Heart sounds: Normal heart sounds. No murmur. No friction rub. No gallop.   Pulmonary:     Effort: Pulmonary effort is normal. No respiratory distress.     Breath sounds: Normal breath sounds. No wheezing or rales.  Chest:     Chest wall: No tenderness.  Abdominal:     General: Bowel sounds are normal. There is no distension.     Palpations: Abdomen is soft. There is no mass.     Tenderness: There is no abdominal tenderness. There is no guarding or rebound.  Skin:    General: Skin is warm and dry.  Neurological:     Mental Status: He is alert and oriented to person, place, and time.  Psychiatric:        Behavior: Behavior normal.        Thought Content: Thought content normal.        Judgment: Judgment normal.         Assessment & Plan:   Patient Active Problem List   Diagnosis Date Noted  . Positive hepatitis C antibody test 12/22/2019  . Degeneration of cervical intervertebral disc 06/24/2018  . Hypertension   . Hepatic steatosis   . Atypical chest pain 06/23/2018  . Chest pain 06/23/2018  . Cholelithiasis 05/31/2018     Problem List Items Addressed This Visit      Other   Positive hepatitis C antibody test - Primary    Mark Villarreal is a 59 year old  male with positive hepatitis C antibody testing.  Risk factors for hepatitis C include age being born between [birth year] and 1963 with no other identifiable risk factors.  Currently with lower quadrant pain and bloating at times but no specific liver related symptoms.  He is treatment nave.  Discussed the pathogenesis, risks if left untreated, transmission, and treatment options for hepatitis C.  Check blood work today including HIV status, hepatitis C RNA level, genotype, hepatic function panel, and fibrosis score.  Met with pharmacy to complete necessary financial assistance paperwork for medication.  Plan for follow-up pending blood work results.      Relevant Orders   Hepatitis C RNA quantitative   Liver Fibrosis, FibroTest-ActiTest   Protime-INR   Hepatitis C genotype   Hepatic function panel   HIV antibody (with reflex)       I have discontinued Roselyn Bering. Poorman's fluticasone, famotidine, hydrochlorothiazide, potassium chloride SA, and Bacillus Coagulans-Inulin (PROBIOTIC FORMULA PO). I am also having him maintain his Aspirin Low Dose, amLODipine, Probiotic Daily, and nebivolol.   Follow-up: Follow-up pending blood work results.   Terri Piedra, MSN, FNP-C Nurse Practitioner [facility] for Infectious Disease [place] number: [phone]

## 2019-12-22 NOTE — Assessment & Plan Note (Signed)
Mark Villarreal is a 59 year old male with positive hepatitis C antibody testing.  Risk factors for hepatitis C include age being born between [birth year] and 1963 with no other identifiable risk factors.  Currently with lower quadrant pain and bloating at times but no specific liver related symptoms.  He is treatment nave.  Discussed the pathogenesis, risks if left untreated, transmission, and treatment options for hepatitis C.  Check blood work today including HIV status, hepatitis C RNA level, genotype, hepatic function panel, and fibrosis score.  Met with pharmacy to complete necessary financial assistance paperwork for medication.  Plan for follow-up pending blood work results.

## 2019-12-22 NOTE — Patient Instructions (Signed)
Nice to see you.  We will check your blood work today and call with the results.  Follow up pending starting medication if necessary.  Limit acetaminophen (Tylenol) usage to no more than 2 grams (2,000 mg) per day.  Avoid alcohol.  Do not share toothbrushes or razors.  Practice safe sex to protect against transmission as well as sexually transmitted disease.    Hepatitis C Hepatitis C is a viral infection of the liver. It can lead to scarring of the liver (cirrhosis), liver failure, or liver cancer. Hepatitis C may go undetected for months or years because people with the infection may not have symptoms, or they may have only mild symptoms. What are the causes? This condition is caused by the hepatitis C virus (HCV). The virus can spread from person to person (is contagious) through:  Blood.  Childbirth. A woman who has hepatitis C can pass it to her baby during birth.  Bodily fluids, such as breast milk, tears, semen, vaginal fluids, and saliva.  Blood transfusions or organ transplants done in the [place] before 1992.  What increases the risk? The following factors may make you more likely to develop this condition:  Having contact with unclean (contaminated) needles or syringes. This may result from: ? Acupuncture. ? Tattoing. ? Body piercing. ? Injecting drugs.  Having unprotected sex with someone who is infected.  Needing treatment to filter your blood (kidney dialysis).  Having HIV (human immunodeficiency virus) or AIDS (acquired immunodeficiency syndrome).  Working in a job that involves contact with blood or bodily fluids, such as health care.  What are the signs or symptoms? Symptoms of this condition include:  Fatigue.  Loss of appetite.  Nausea.  Vomiting.  Abdominal pain.  Dark yellow urine.  Yellowish skin and eyes (jaundice).  Itchy skin.  Clay-colored bowel movements.  Joint pain.  Bleeding and bruising easily.  Fluid building  up in your stomach (ascites).  In some cases, you may not have any symptoms. How is this diagnosed? This condition is diagnosed with:  Blood tests.  Other tests to check how well your liver is functioning. They may include: ? Magnetic resonance elastography (MRE). This imaging test uses MRIs and sound waves to measure liver stiffness. ? Transient elastography. This imaging test uses ultrasounds to measure liver stiffness. ? Liver biopsy. This test requires taking a small tissue sample from your liver to examine it under a microscope.  How is this treated? Your health care provider may perform noninvasive tests or a liver biopsy to help decide the best course of treatment. Treatment may include:  Antiviral medicines and other medicines.  Follow-up treatments every 6-12 months for infections or other liver conditions.  Receiving a donated liver (liver transplant).  Follow these instructions at home: Medicines  Take over-the-counter and prescription medicines only as told by your health care provider.  Take your antiviral medicine as told by your health care provider. Do not stop taking the antiviral even if you start to feel better.  Do not take any medicines unless approved by your health care provider, including over-the-counter medicines and birth control pills. Activity  Rest as needed.  Do not have sex unless approved by your health care provider.  Ask your health care provider when you may return to school or work. Eating and drinking  Eat a balanced diet with plenty of fruits and vegetables, whole grains, and lowfat (lean) meats or non-meat proteins (such as beans or tofu).  Drink enough fluids to keep  your urine clear or pale yellow.  Do not drink alcohol. General instructions  Do not share toothbrushes, nail clippers, or razors.  Wash your hands frequently with soap and water. If soap and water are not available, use hand sanitizer.  Cover any cuts or open  sores on your skin to prevent spreading the virus.  Keep all follow-up visits as told by your health care provider. This is important. You may need follow-up visits every 6-12 months. How is this prevented? There is no vaccine for hepatitis C. The only way to prevent the disease is to reduce the risk of exposure to the virus. Make sure you:  Wash your hands frequently with soap and water. If soap and water are not available, use hand sanitizer.  Do not share needles or syringes.  Practice safe sex and use condoms.  Avoid handling blood or bodily fluids without gloves or other protection.  Avoid getting tattoos or piercings in shops or other locations that are not clean.  Contact a health care provider if:  You have a fever.  You develop abdominal pain.  You pass dark urine.  You pass clay-colored stools.  You develop joint pain. Get help right away if:  You have increasing fatigue or weakness.  You lose your appetite.  You cannot eat or drink without vomiting.  You develop jaundice or your jaundice gets worse.  You bruise or bleed easily. Summary  Hepatitis C is a viral infection of the liver. It can lead to scarring of the liver (cirrhosis), liver failure, or liver cancer.  The hepatitis C virus (HCV) causes this condition. The virus can pass from person to person (is contagious).  You should not take any medicines unless approved by your health care provider. This includes over-the-counter medicines and birth control pills. This information is not intended to replace advice given to you by your health care provider. Make sure you discuss any questions you have with your health care provider. Document Released: 08/14/2000 Document Revised: 09/22/2016 Document Reviewed: 09/22/2016 Elsevier Interactive Patient Education  [employer].

## 2019-12-27 LAB — HEPATIC FUNCTION PANEL
AG Ratio: 1.3 (calc) (ref 1.0–2.5)
ALT: 154 U/L — ABNORMAL HIGH (ref 9–46)
AST: 91 U/L — ABNORMAL HIGH (ref 10–35)
Albumin: 3.8 g/dL (ref 3.6–5.1)
Alkaline phosphatase (APISO): 76 U/L (ref 35–144)
Bilirubin, Direct: 0.2 mg/dL (ref 0.0–0.2)
Globulin: 3 g/dL (calc) (ref 1.9–3.7)
Indirect Bilirubin: 0.7 mg/dL (calc) (ref 0.2–1.2)
Total Bilirubin: 0.9 mg/dL (ref 0.2–1.2)
Total Protein: 6.8 g/dL (ref 6.1–8.1)

## 2019-12-27 LAB — HIV ANTIBODY (ROUTINE TESTING W REFLEX): HIV 1&2 Ab, 4th Generation: NONREACTIVE

## 2019-12-27 LAB — LIVER FIBROSIS, FIBROTEST-ACTITEST
ALT: 159 U/L — ABNORMAL HIGH (ref 9–46)
Alpha-2-Macroglobulin: 452 mg/dL — ABNORMAL HIGH (ref 106–279)
Apolipoprotein A1: 175 mg/dL (ref 94–176)
Bilirubin: 0.7 mg/dL (ref 0.2–1.2)
Fibrosis Score: 0.82
GGT: 53 U/L (ref 3–85)
Haptoglobin: 31 mg/dL — ABNORMAL LOW (ref 43–212)
Necroinflammat ACT Score: 0.86
Reference ID: 3367275

## 2019-12-27 LAB — HEPATITIS C GENOTYPE

## 2019-12-27 LAB — HEPATITIS C RNA QUANTITATIVE
HCV Quantitative Log: 5.58 Log IU/mL — ABNORMAL HIGH
HCV RNA, PCR, QN: 379000 IU/mL — ABNORMAL HIGH

## 2019-12-27 LAB — PROTIME-INR
INR: 1.1
Prothrombin Time: 11.4 s (ref 9.0–11.5)

## 2019-12-29 ENCOUNTER — Telehealth: Payer: Self-pay | Admitting: Family

## 2019-12-29 MED ORDER — SOFOSBUVIR-VELPATASVIR 400-100 MG PO TABS: 1.0000 | ORAL_TABLET | Freq: Every day | ORAL | 2 refills | Status: DC

## 2019-12-29 NOTE — Telephone Encounter (Signed)
Spoke with Mr. Mark Villarreal regarding his results.  Mr. Mark Villarreal has genotype 1 a chronic hepatitis C with initial viral load of 379,000.  Fibrosis score of F4 concerning for cirrhosis with corresponding APRI of 1.777 and FIB4 of 3.21 concerning for advanced fibrosis.  He follows with Dr. Collene Mares for gastroenterology.  Appears to have compensated cirrhosis with child Pugh score of A at present and will treat with 12 weeks of Epclusa.

## 2020-01-02 ENCOUNTER — Telehealth: Payer: Self-pay | Admitting: Pharmacy Technician

## 2020-01-02 NOTE — Telephone Encounter (Signed)
[facility] Patient Advocate Encounter   Received notification from [facility] that prior authorization for Mark Villarreal is required.   PA submitted on 01/02/2020 Key [id number] Status is pending    [facility] will continue to follow.  Bartholomew Crews, CPhT Specialty Pharmacy Patient [facility] for Infectious Disease Phone: [phone] Fax: [phone] 01/02/2020 10:57 AM

## 2020-01-02 NOTE — Telephone Encounter (Signed)
Prior Authorization has been submitted to [employer] for determination.

## 2020-01-05 ENCOUNTER — Encounter: Payer: Self-pay | Admitting: Pharmacy Technician

## 2020-01-05 MED FILL — SOFOSBUVIR-VELPATASVIR 400-: 400-100 | 28 days supply | Qty: 28 | Fill #0

## 2020-01-05 NOTE — Telephone Encounter (Signed)
[facility] Patient Advocate Encounter  Prior Authorization for Mark Villarreal has been approved.    Date range not provided  Patients co-pay is $35.00 Coupon will bring cost down to $5.00     [facility] will continue to follow.  Bartholomew Crews, CPhT Specialty Pharmacy Patient [facility] for Infectious Disease Phone: [phone] Fax: [phone] 01/05/2020 1:22 PM

## 2020-01-08 ENCOUNTER — Telehealth: Payer: Self-pay | Admitting: Pharmacist

## 2020-01-08 NOTE — Telephone Encounter (Signed)
Patient is approved to receive Epclusa x 12 weeks for chronic Hepatitis C infection. Counseled patient to take Epclusa daily with or without food. Encouraged patient not to miss any doses and explained how their chance of cure could go down with each dose missed. Counseled patient on what to do if dose is missed - if it is closer to the missed dose take immediately; if closer to next dose skip dose and take the next dose at the usual time.   Counseled patient on common side effects such as headache, fatigue, and nausea and that these normally decrease with time. I reviewed patient medications and found no drug interactions. Discussed with patient that there are several drug interactions including acid suppressants. Instructed patient to call clinic if he wishes to start a new medication during course of therapy. Also advised patient to call if him experiences any side effects. Patient will follow-up with me in the pharmacy clinic on 02/08/2020.

## 2020-01-30 MED FILL — SOFOSBUVIR-VELPATASVIR 400-: 400-100 | 28 days supply | Qty: 28 | Fill #1

## 2020-02-08 ENCOUNTER — Ambulatory Visit: Payer: 59 | Admitting: Pharmacist

## 2020-02-08 ENCOUNTER — Other Ambulatory Visit: Payer: Self-pay

## 2020-02-08 DIAGNOSIS — B182 Chronic viral hepatitis C: Secondary | ICD-10-CM

## 2020-02-08 DIAGNOSIS — Z79899 Other long term (current) drug therapy: Secondary | ICD-10-CM

## 2020-02-08 NOTE — Telephone Encounter (Signed)
[facility] Patient Advocate Encounter  Patient refill history at [facility]:  01/05/2020 01/30/2020  $5.00 copay each time. Final refill call timed for 02/22/2020 to set up his last shipment.

## 2020-02-08 NOTE — Progress Notes (Signed)
HPI: Mark Villarreal is a 59 y.o. male who presents to the [place] clinic for Hepatitis C follow-up.  Medication: Epclusa x 12 weeks  Start Date: 01/08/20  Hepatitis C Genotype: 1a  Fibrosis Score: F4, compensated  Hepatitis C RNA: 379,000 on 12/22/19  Patient Active Problem List   Diagnosis Date Noted  . Positive hepatitis C antibody test 12/22/2019  . Degeneration of cervical intervertebral disc 06/24/2018  . Hypertension   . Hepatic steatosis   . Atypical chest pain 06/23/2018  . Chest pain 06/23/2018  . Cholelithiasis 05/31/2018    Patient's Medications  New Prescriptions   No medications on file  Previous Medications   AMLODIPINE (NORVASC) 10 MG TABLET    Take 10 mg by mouth daily.   ASPIRIN LOW DOSE 81 MG EC TABLET    Take 81 mg by mouth at bedtime.    NEBIVOLOL (BYSTOLIC) 10 MG TABLET    Take 10 mg by mouth daily.   PROBIOTIC PRODUCT (PROBIOTIC DAILY) CAPS    Take by mouth. Ultra Flora Probiotic - prescribed by Dr. Collene Mares   SOFOSBUVIR-VELPATASVIR (EPCLUSA) 400-100 MG TABS    Take 1 tablet by mouth daily. Take 1 tablet by mouth daily.  Modified Medications   No medications on file  Discontinued Medications   No medications on file    Allergies: No Known Allergies  Past Medical History: Past Medical History:  Diagnosis Date  . Acute kidney injury ([place])   . Chest pain 06/23/2018  . Cholelithiasis 05/2018  . Hepatic steatosis   . Hypertension     Social History: Social History   Socioeconomic History  . Marital status: Married    Spouse name: Not on file  . Number of children: Not on file  . Years of education: Not on file  . Highest education level: Not on file  Occupational History  . Not on file  Tobacco Use  . Smoking status: Never Smoker  . Smokeless tobacco: Never Used  Vaping Use  . Vaping Use: Never used  Substance and Sexual Activity  . Alcohol use: Yes    Comment: socially  . Drug use: Never  . Sexual activity: Not on file    Other Topics Concern  . Not on file  Social History Narrative  . Not on file   Social Determinants of Health   Financial Resource Strain:   . Difficulty of Paying Living Expenses:   Food Insecurity:   . Worried About Charity fundraiser in the Last Year:   . Arboriculturist in the Last Year:   Transportation Needs:   . Film/video editor (Medical):   Marland Kitchen Lack of Transportation (Non-Medical):   Physical Activity:   . Days of Exercise per Week:   . Minutes of Exercise per Session:   Stress:   . Feeling of Stress :   Social Connections:   . Frequency of Communication with Friends and Family:   . Frequency of Social Gatherings with Friends and Family:   . Attends Religious Services:   . Active Member of Clubs or Organizations:   . Attends Archivist Meetings:   Marland Kitchen Marital Status:     Labs: Hepatitis C Lab Results  Component Value Date   HCVGENOTYPE 1a 12/22/2019   HCVRNAPCRQN 379,000 (H) 12/22/2019   FIBROSTAGE F4 12/22/2019   Hepatitis B Lab Results  Component Value Date   HEPBSAG NON REACTIVE 12/07/2019   HEPBCAB NON REACTIVE 12/07/2019   Hepatitis A  No results found for: HAV HIV Lab Results  Component Value Date   HIV NON-REACTIVE 12/22/2019   HIV Non Reactive 06/23/2018   Lab Results  Component Value Date   CREATININE 0.95 12/07/2019   CREATININE 1.42 (H) 07/14/2018   CREATININE 1.14 06/24/2018   CREATININE 1.28 (H) 06/23/2018   CREATININE 1.58 (H) 06/23/2018   Lab Results  Component Value Date   AST 91 (H) 12/22/2019   AST 98 (H) 12/07/2019   AST 85 (H) 07/14/2018   ALT 159 (H) 12/22/2019   ALT 154 (H) 12/22/2019   ALT 138 (H) 12/07/2019   INR 1.1 12/22/2019    Assessment: Mark Villarreal is here today to follow up for his chronic Hepatitis C infection. He started 12 weeks of Epclusa back on 5/10 and is doing well so far. He is suffering from some fatigue since starting, which is common. He takes his Epclusa at nighttime around 8pm with  dinner and can only stay awake for about 30 more minutes. It does not interfere with his job or morning/afternoon routine. He is also experiencing some headaches but they do not require any treatment. No other issues. He hasn't missed any doses since starting, which I congratulated him on. He has already started his 2nd bottle. Will check labs today and have him see Marya Amsler at the end of treatment since he has cirrhosis.    Plan: - Hepatitis C RNA + CMET today - Continue Epclusa x 12 weeks - F/u with Marya Amsler 8/17 at 845am  Brealynn Contino L. Tareek Sabo, PharmD, BCIDP, AAHIVP, CPP Clinical Pharmacist Practitioner Infectious Diseases [place] for Infectious Disease 02/08/2020, 9:51 AM

## 2020-02-09 LAB — HEPATITIS C RNA QUANTITATIVE
HCV Quantitative Log: <1.18 Log IU/mL
HCV RNA, PCR, QN: <15 IU/mL

## 2020-02-09 LAB — COMPREHENSIVE METABOLIC PANEL
AG Ratio: 1.4 (calc) (ref 1.0–2.5)
ALT: 21 U/L (ref 9–46)
AST: 23 U/L (ref 10–35)
Albumin: 3.7 g/dL (ref 3.6–5.1)
Alkaline phosphatase (APISO): 73 U/L (ref 35–144)
BUN: 12 mg/dL (ref 7–25)
CO2: 28 mmol/L (ref 20–32)
Calcium: 9 mg/dL (ref 8.6–10.3)
Chloride: 104 mmol/L (ref 98–110)
Creat: 1.17 mg/dL (ref 0.70–1.33)
Globulin: 2.7 g/dL (calc) (ref 1.9–3.7)
Glucose, Bld: 113 mg/dL — ABNORMAL HIGH (ref 65–99)
Potassium: 4 mmol/L (ref 3.5–5.3)
Sodium: 140 mmol/L (ref 135–146)
Total Bilirubin: 0.5 mg/dL (ref 0.2–1.2)
Total Protein: 6.4 g/dL (ref 6.1–8.1)

## 2020-02-20 IMAGING — CT CT ABD-PELV W/ CM
2 of 5 series · 16 of 46 positions shown, 18 images · IV contrast (APPLIED)
Comparison: 06/23/2018 ultrasound of the abdomen

CLINICAL DATA: Rectal pain for 2 weeks

EXAM:
CT ABDOMEN AND PELVIS WITH CONTRAST
TECHNIQUE: Multidetector CT imaging of the abdomen and pelvis was performed
using the standard protocol following bolus administration of
intravenous contrast.
CONTRAST:  100mL OMNIPAQUE IOHEXOL 300 MG/ML  SOLN

[Series 3: abd/ pelvis 5.0 i30f 2 · axial · 0.92mm/px · z∈[+686,+1126]mm · 13 of 100 slices shown, 15 images]
[im 6/100  soft-tissue]
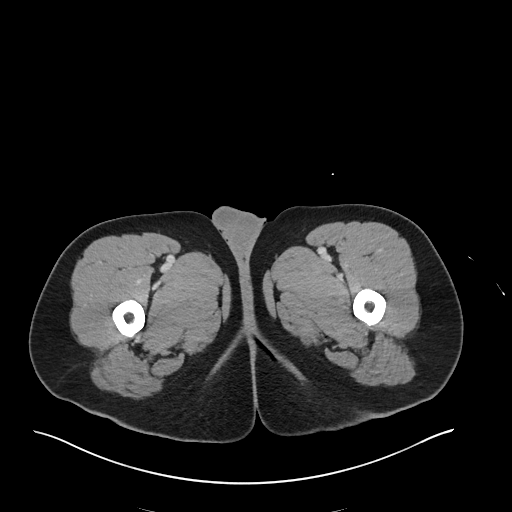
[im 6/100  bone]
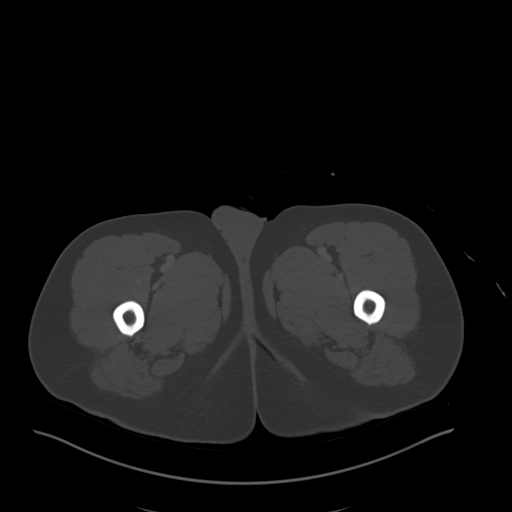
[im 16/100  soft-tissue]
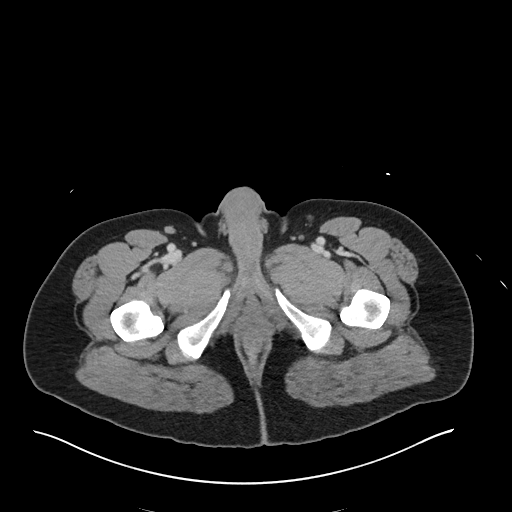
[im 21/100  soft-tissue]
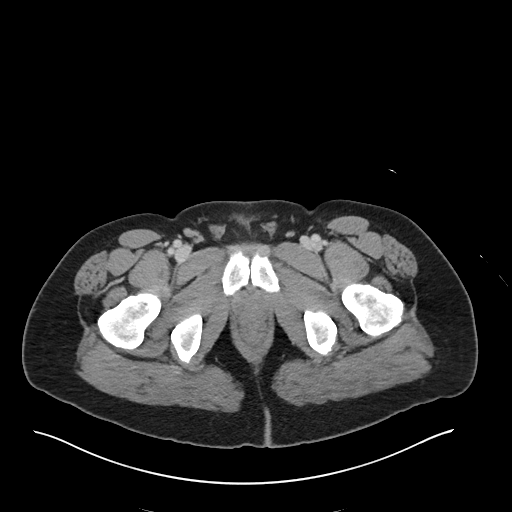
[im 27/100  soft-tissue]
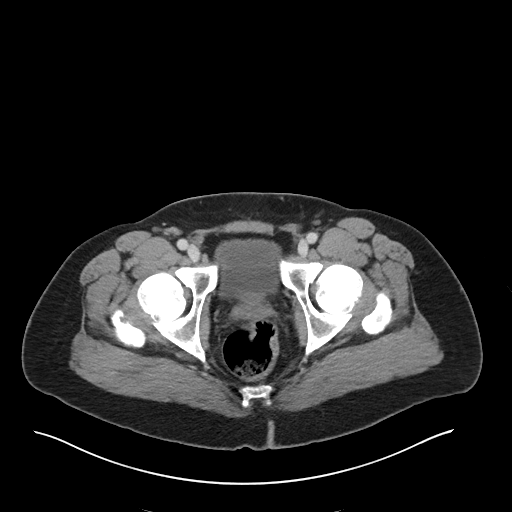
[im 37/100  soft-tissue]
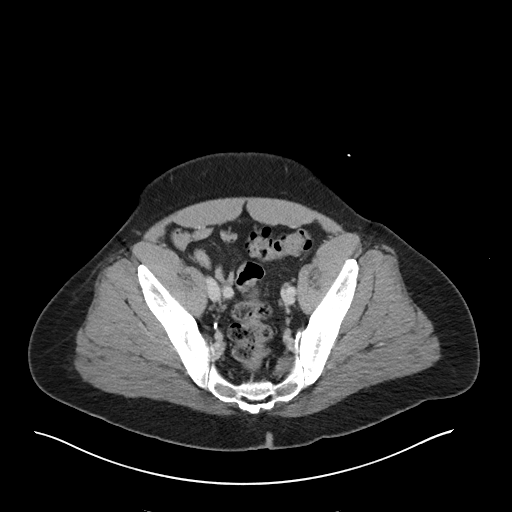
[im 42/100  soft-tissue]
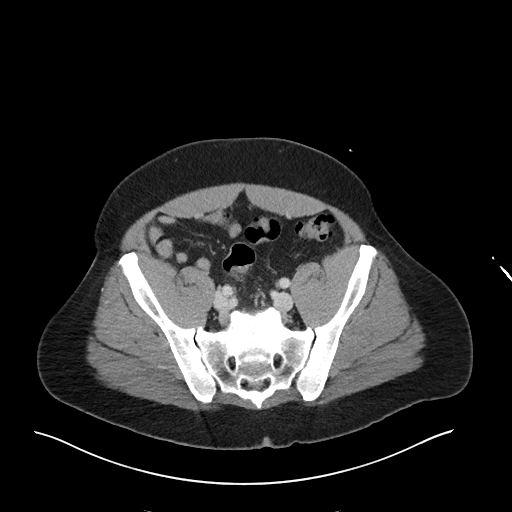
[im 53/100  soft-tissue]
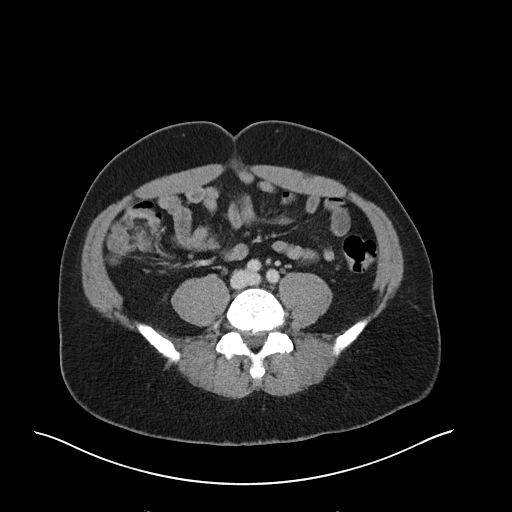
[im 58/100  soft-tissue]
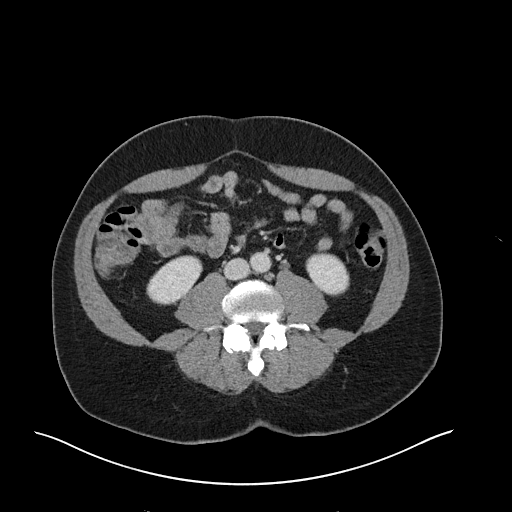
[im 63/100  soft-tissue]
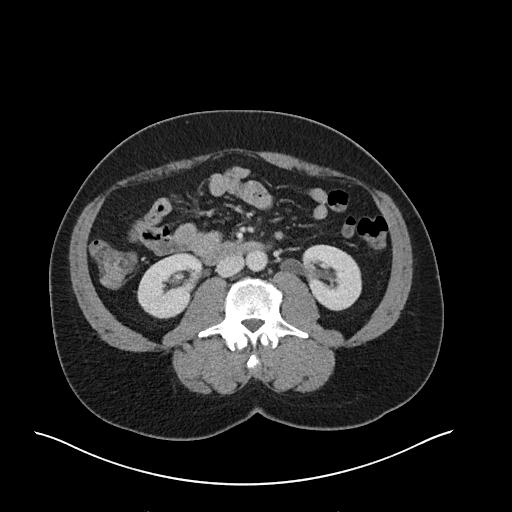
[im 63/100  bone]
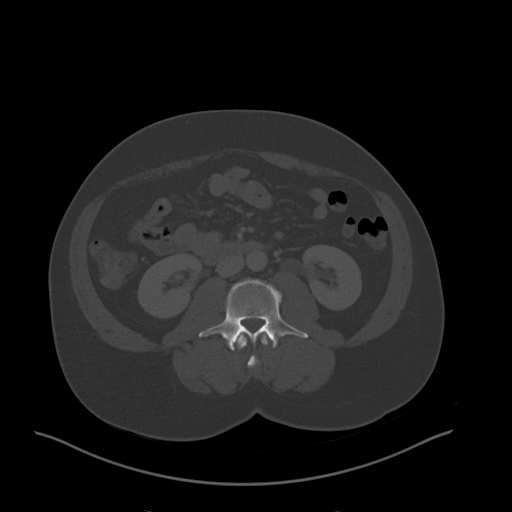
[im 73/100  soft-tissue]
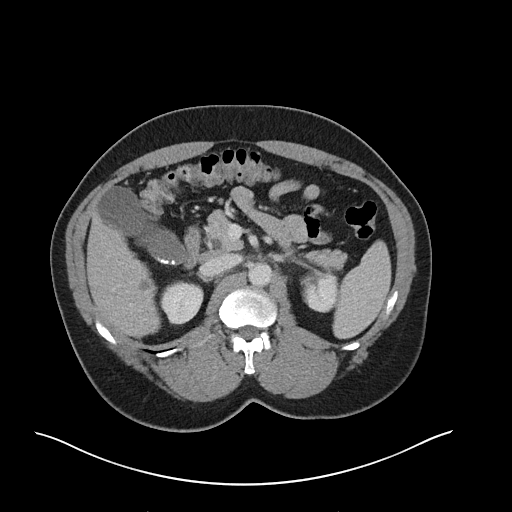
[im 79/100  soft-tissue]
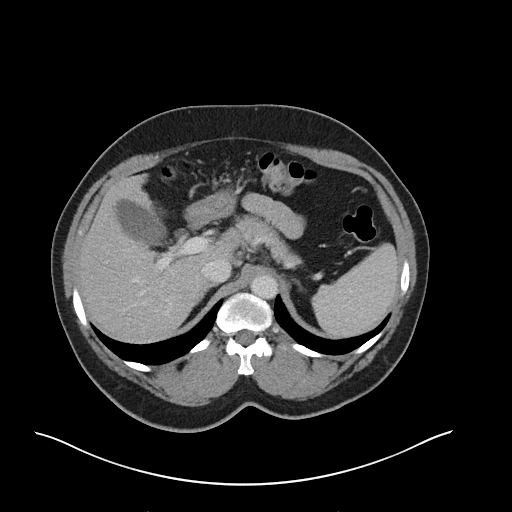
[im 84/100  soft-tissue]
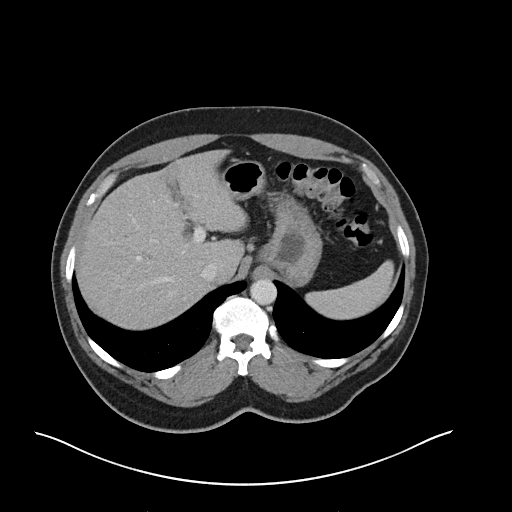
[im 94/100  soft-tissue]
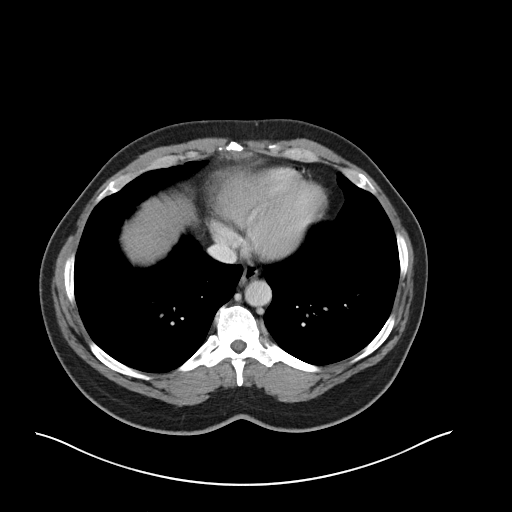

[Series 6: coronal soft tissue · coronal · 0.87mm/px · 3 of 117 slices shown]
[im 39/117  soft-tissue]
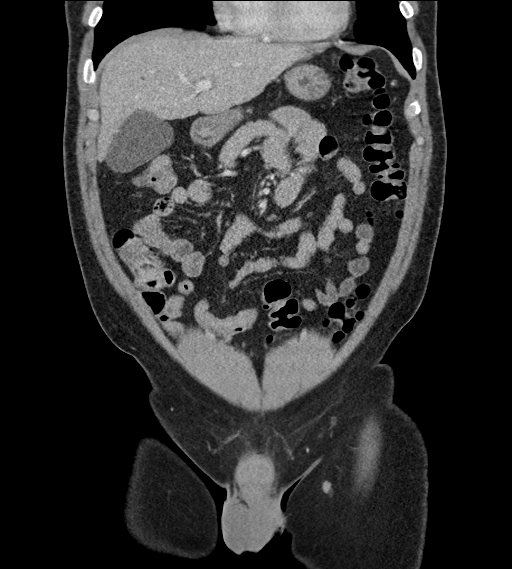
[im 52/117  soft-tissue]
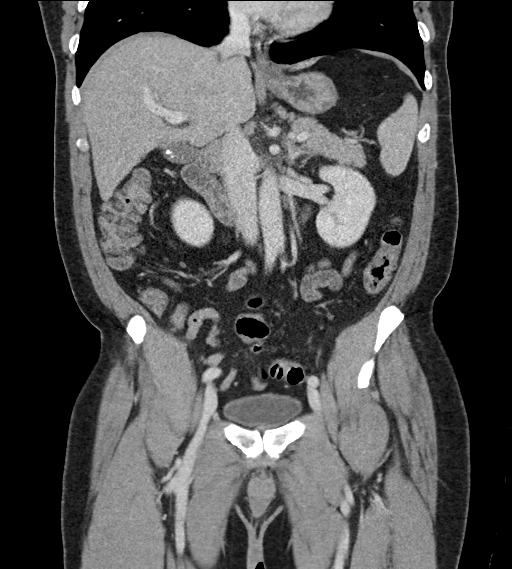
[im 65/117  soft-tissue]
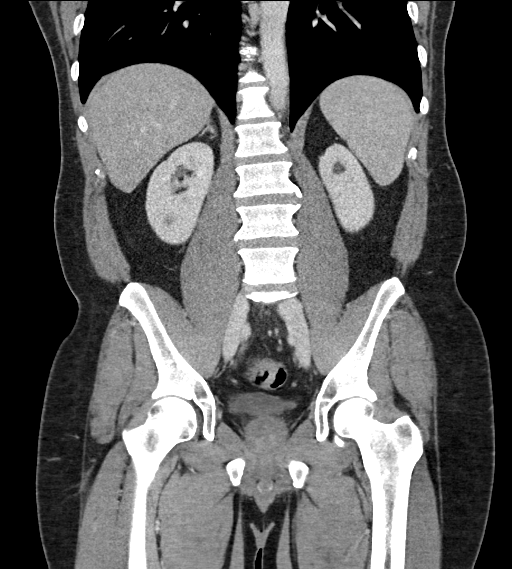

[16 of 46 positions shown; findings below may reference images not displayed]

FINDINGS: Lower chest: No acute abnormality.

Hepatobiliary: Multiple small gallstones are noted in well distended
gallbladder. No pericholecystic fluid is noted. 1 cm cyst is noted
in the right lobe of the liver.

Pancreas: Unremarkable. No pancreatic ductal dilatation or
surrounding inflammatory changes.

Spleen: Normal in size without focal abnormality.

Adrenals/Urinary Tract: Adrenal glands are within normal limits.
Bilateral renal cystic changes are seen. Some areas of scarring are
seen within the right kidney. Nonobstructing upper pole calculi are
noted on the right. Fullness of the left collecting system and
proximal left ureter is noted without definitive stone. This may be
chronic in nature. The bladder is decompressed.

Stomach/Bowel: Appendix is within normal limits. The large and small
bowel show no acute abnormality. Stomach is within normal limits. No
abnormality to correspond with the patient's given clinical history
is noted.

Vascular/Lymphatic: Atherosclerotic calcifications are noted

Reproductive: Prostate is unremarkable.

Other: No abdominal wall hernia or abnormality. No abdominopelvic
ascites.

Musculoskeletal: Degenerative changes of the lumbar spine are noted.
IMPRESSION: Cholelithiasis similar to that seen on recent ultrasound
examination.

Proximal dilatation of the left renal collecting system without
definitive calculus. Correlation with laboratory values is
recommended.

Nonobstructing right renal calculi.

No acute abnormality is noted.

## 2020-02-22 ENCOUNTER — Telehealth: Payer: Self-pay | Admitting: Pharmacy Technician

## 2020-02-22 NOTE — Telephone Encounter (Signed)
[facility] Patient Advocate Encounter  Left a HIPPA compliant voicemail with the patient.  When they return call, I need to let him know I have a box of Epclusa at the clinic for him.  His insurance will not allow him to refill early.  We will replace our box on 02/27/2020 when they allow the refill to process.    Venida Jarvis. Nadara Mustard [place] Patient [facility] for Infectious Disease Phone: [phone] Fax:  [phone]

## 2020-02-26 NOTE — Telephone Encounter (Addendum)
[facility] Patient Advocate Encounter   Received notification from [employer] that prior authorization for Epclusa generic is required for the final 4 weeks of therapy.  Prior authorization was for 12 weeks through 03/26/2020, but they state it goes over their limits non the less.  I asked for it to be expedited for the final 4 weeks.   PA submitted on 02/28/2020 Status is CANCELLED  Turns out it needs to be filled through [employer].    [facility] will continue to follow.   Mark Villarreal. Nadara Mustard [place] Patient [facility] Phone: [phone] Fax:  [phone]

## 2020-02-28 ENCOUNTER — Telehealth: Payer: Self-pay | Admitting: Pharmacy Technician

## 2020-03-08 ENCOUNTER — Other Ambulatory Visit: Payer: Self-pay

## 2020-03-08 ENCOUNTER — Emergency Department (HOSPITAL_COMMUNITY): Payer: 59

## 2020-03-08 ENCOUNTER — Emergency Department (HOSPITAL_COMMUNITY)
Admission: EM | Admit: 2020-03-08 | Discharge: 2020-03-08 | Disposition: A | Discharge status: Home / Self Care | Payer: 59 | Attending: Emergency Medicine | Admitting: Emergency Medicine

## 2020-03-08 DIAGNOSIS — Z79899 Other long term (current) drug therapy: Secondary | ICD-10-CM | POA: Insufficient documentation

## 2020-03-08 DIAGNOSIS — R1111 Vomiting without nausea: Secondary | ICD-10-CM

## 2020-03-08 DIAGNOSIS — U071 COVID-19: Secondary | ICD-10-CM | POA: Diagnosis not present

## 2020-03-08 DIAGNOSIS — I10 Essential (primary) hypertension: Secondary | ICD-10-CM | POA: Insufficient documentation

## 2020-03-08 DIAGNOSIS — R109 Unspecified abdominal pain: Secondary | ICD-10-CM

## 2020-03-08 DIAGNOSIS — Z7982 Long term (current) use of aspirin: Secondary | ICD-10-CM | POA: Diagnosis not present

## 2020-03-08 LAB — CBC
HCT: 47.5 % (ref 39.0–52.0)
Hemoglobin: 15.2 g/dL (ref 13.0–17.0)
MCH: 27.2 pg (ref 26.0–34.0)
MCHC: 32 g/dL (ref 30.0–36.0)
MCV: 85.1 fL (ref 80.0–100.0)
Platelets: 85 10*3/uL — ABNORMAL LOW (ref 150–400)
RBC: 5.58 MIL/uL (ref 4.22–5.81)
RDW: 13.8 % (ref 11.5–15.5)
WBC: 2.3 10*3/uL — ABNORMAL LOW (ref 4.0–10.5)
nRBC: 0 % (ref 0.0–0.2)

## 2020-03-08 LAB — COMPREHENSIVE METABOLIC PANEL
ALT: 36 U/L (ref 0–44)
AST: 40 U/L (ref 15–41)
Albumin: 3 g/dL — ABNORMAL LOW (ref 3.5–5.0)
Alkaline Phosphatase: 50 U/L (ref 38–126)
Anion gap: 11 (ref 5–15)
BUN: 12 mg/dL (ref 6–20)
CO2: 25 mmol/L (ref 22–32)
Calcium: 8.5 mg/dL — ABNORMAL LOW (ref 8.9–10.3)
Chloride: 102 mmol/L (ref 98–111)
Creatinine, Ser: 1.26 mg/dL — ABNORMAL HIGH (ref 0.61–1.24)
GFR calc Af Amer: >60 mL/min (ref >60)
GFR calc non Af Amer: >60 mL/min (ref >60)
Glucose, Bld: 100 mg/dL — ABNORMAL HIGH (ref 70–99)
Potassium: 3.2 mmol/L — ABNORMAL LOW (ref 3.5–5.1)
Sodium: 138 mmol/L (ref 135–145)
Total Bilirubin: 0.9 mg/dL (ref 0.3–1.2)
Total Protein: 6.8 g/dL (ref 6.5–8.1)

## 2020-03-08 LAB — URINALYSIS, ROUTINE W REFLEX MICROSCOPIC
Bilirubin Urine: NEGATIVE
Glucose, UA: NEGATIVE mg/dL
Hgb urine dipstick: NEGATIVE
Ketones, ur: 5 mg/dL — AB
Leukocytes,Ua: NEGATIVE
Nitrite: NEGATIVE
Protein, ur: NEGATIVE mg/dL
Specific Gravity, Urine: 1.019 (ref 1.005–1.030)
pH: 5 (ref 5.0–8.0)

## 2020-03-08 LAB — SARS CORONAVIRUS 2 BY RT PCR (HOSPITAL ORDER, PERFORMED IN ~~LOC~~ HOSPITAL LAB): SARS Coronavirus 2: POSITIVE — AB

## 2020-03-08 LAB — LIPASE, BLOOD: Lipase: 60 U/L — ABNORMAL HIGH (ref 11–51)

## 2020-03-08 MED ORDER — DOXYCYCLINE MONOHYDRATE 100 MG PO CAPS: 100.0000 mg | ORAL_CAPSULE | Freq: Two times a day (BID) | ORAL | 0 refills | Status: AC

## 2020-03-08 MED ORDER — IOHEXOL 300 MG/ML  SOLN
100.0000 mL | Freq: Once | INTRAMUSCULAR | Status: AC | PRN
  Administered 2020-03-08: 100 mL via INTRAVENOUS

## 2020-03-08 MED ORDER — SODIUM CHLORIDE 0.9 % IV BOLUS
1000.0000 mL | Freq: Once | INTRAVENOUS | Status: AC
  Administered 2020-03-08: 1000 mL via INTRAVENOUS

## 2020-03-08 MED ORDER — ONDANSETRON HCL 4 MG/2ML IJ SOLN
4.0000 mg | Freq: Once | INTRAMUSCULAR | Status: AC
  Administered 2020-03-08: 4 mg via INTRAVENOUS
  Filled 2020-03-08: qty 2

## 2020-03-08 NOTE — ED Provider Notes (Signed)
Medical screening examination/treatment/procedure(s) were conducted as a shared visit with non-physician practitioner(s) and myself.  I personally evaluated the patient during the encounter.  Clinical Impression:   Final diagnoses:  Abdominal pain, unspecified abdominal location  Non-intractable vomiting without nausea, unspecified vomiting type    This patient is a 59 year old male, nonsurgical abdomen history.  Has followed with gastroenterology in the past thought to have gallstones but nonsurgical at that time.  Was doing well until the last several days when he started to have some ill feeling.  Mostly of left-sided abdominal discomfort.  For example he states that when he was mowing his yard on a riding lawnmower it did not hurt but when he got out to use the weed [place] he was having severe pain.  He was able to eat pizza last night but this morning when brushing his teeth he vomited.  When they called the gastroenterologist office Dr. Collene Mares recommended emergency department evaluation.  On my exam the patient has an abdomen which is very soft, no guarding or peritoneal signs, nonfocal though he seems to have more tenderness in the left lower and left upper quadrant.  There is no Murphy sign, no specific pain to McBurney's point, no CVA tenderness.  Cardiac and pulmonary exams are unremarkable, vital signs are reassuring.  Labs pending at this time, anticipate CT scan.   Noemi Chapel, MD 03/09/20 1018

## 2020-03-08 NOTE — Discharge Instructions (Signed)
Your imaging today was concerning for a pneumonia possibly due to either bacteria or COVID19. You were swabbed for COVID 19 during your stay. Prescription given to cover for possible bacterial infection. Due to the concern for possible COVID please quarantine until test results.

## 2020-03-08 NOTE — ED Notes (Signed)
Pt transported to CT ?

## 2020-03-08 NOTE — ED Provider Notes (Cosign Needed)
  Physical Exam  BP 126/81   Pulse 75   Temp 99.4 F (37.4 C) (Oral)   Resp 16   SpO2 97%   Physical Exam  Agree with physical exam completed by prior care team. Please see their note for full exam.   ED Course/Procedures     Procedures  MDM   Medical Decision Making: Care of patient assumed from off going team at 3:54 PM. Agree with history, physical exam and plan. See their note for further details. Briefly, 59 y.o. male:  Pt p/w Abdominal pain, nausea, vomiting since Tuesday - tolerating liquids - unable to tolerate solids (ate last night, threw up this am) - Follows with outpatient GI - Being treated for hep C, hx of gallstones wo cholystitis  - Does not look toxic - Labs: lipase 60, leukopenia  Current plan is as follows:  - F/u CT abdomen/pelvis  On reassessment pt afebrile, HDS. CT imaging wo concern for intra-abdominal process leading to presenting complaint. CT was significant for findings consistent with pna/possibly 2/2 to COVID as patient has not yet been vaccinated. Rx given for course of doxycycline for possible bacterial cause of pna. COVID swab pending. Discussed need for quarantining while waiting for COVID test results. Patient/wife stated they had access to online portal and would check results at home. Pt requested I contact outpatient GI provider. Contact with GI was attempted however unsuccessful as office was closed. Plan for patient to follow up with PCP/GI as outpatient for continued symptoms. Patient discharged in stable condition.   Vitals:   03/08/20 1247 03/08/20 1347 03/08/20 1422  BP: (!) 125/91 128/81 126/81  Pulse: 81 76 75  Resp: 19  16  Temp: 99.4 F (37.4 C)    TempSrc: Oral    SpO2: 96% 97% 97%           Kennyth Lose, MD 03/09/20 [phone]

## 2020-03-08 NOTE — ED Provider Notes (Signed)
[facility] EMERGENCY DEPARTMENT Provider Note   CSN: [id number] Arrival date & time: 03/08/20  1242     History Chief Complaint  Patient presents with  . Abdominal Pain    Mark Villarreal is a 59 y.o. male presenting for evaluation of abdominal pain, nausea, vomiting.  Patient states that the past 4 days, he has had persistent nausea and vomiting.  He states whenever he eats solid foods, he later vomits it up, though usually not immediately.  He reports feeling very tired and fatigued, especially with exertion.  He reports abdominal pain, mostly on the left lateral side.  He called his GI doctor, Dr. Collene Mares, who recommended ER evaluation.  He has been able to tolerate liquids without difficulty.  He denies fevers, chills, chest pain, shortness of breath, urinary symptoms, abnormal bowel movements.  Additional history obtained from chart review.  Patient with a history of gallstones, last CT scan in March 2021 showed stones in the duct, but no need for imminent surgery.  Patient currently being treated for hep C.  Additional history of hypertension.  HPI     Past Medical History:  Diagnosis Date  . Acute kidney injury ([place])   . Chest pain 06/23/2018  . Cholelithiasis 05/2018  . Hepatic steatosis   . Hypertension     Patient Active Problem List   Diagnosis Date Noted  . Positive hepatitis C antibody test 12/22/2019  . Degeneration of cervical intervertebral disc 06/24/2018  . Hypertension   . Hepatic steatosis   . Atypical chest pain 06/23/2018  . Chest pain 06/23/2018  . Cholelithiasis 05/31/2018    Past Surgical History:  Procedure Laterality Date  . WRIST SURGERY Right        No family history on file.  Social History   Tobacco Use  . Smoking status: Never Smoker  . Smokeless tobacco: Never Used  Vaping Use  . Vaping Use: Never used  Substance Use Topics  . Alcohol use: Yes    Comment: socially  . Drug use: Never    Home  Medications Prior to Admission medications   Medication Sig Start Date End Date Taking? Authorizing Provider  amLODipine (NORVASC) 10 MG tablet Take 10 mg by mouth at bedtime.  07/07/18  Yes [provider]  ASPIRIN LOW DOSE 81 MG EC tablet Take 81 mg by mouth at bedtime.  06/09/18  Yes [provider]  BYSTOLIC 20 MG TABS Take 20 mg by mouth daily.  03/06/20  Yes [provider]  Probiotic Product (PROBIOTIC DAILY) CAPS Take 1 capsule by mouth daily. Ultra Flora Probiotic - prescribed by Dr. Collene Mares    Yes [provider]  Sofosbuvir-Velpatasvir (EPCLUSA) 400-100 MG TABS Take 1 tablet by mouth daily. Take 1 tablet by mouth daily. Patient taking differently: Take 1 tablet by mouth at bedtime.  12/29/19  Yes Golden Circle, FNP    Allergies    Patient has no known allergies.  Review of Systems   Review of Systems  Gastrointestinal: Positive for abdominal pain, nausea and vomiting.  All other systems reviewed and are negative.   Physical Exam Updated Vital Signs BP 126/81   Pulse 75   Temp 99.4 F (37.4 C) (Oral)   Resp 16   SpO2 97%   Physical Exam Vitals and nursing note reviewed.  Constitutional:      General: He is not in acute distress.    Appearance: He is well-developed. He is ill-appearing. He is not toxic-appearing.  Comments: Appears ill, nontoxic  HENT:     Head: Normocephalic and atraumatic.  Eyes:     Conjunctiva/sclera: Conjunctivae normal.     Pupils: Pupils are equal, round, and reactive to light.  Cardiovascular:     Rate and Rhythm: Normal rate and regular rhythm.  Pulmonary:     Effort: Pulmonary effort is normal. No respiratory distress.     Breath sounds: Normal breath sounds. No wheezing.  Abdominal:     General: Bowel sounds are normal. There is no distension.     Palpations: Abdomen is soft. There is no mass.     Tenderness: There is abdominal tenderness. There is no guarding or rebound.     Comments: Diffuse  mild tenderness palpation the abdomen.  No focal pain at McBurney's point.  Negative Murphy's.  No rigidity, guarding, distention.  Negative rebound.  No peritonitis.  No CVA tenderness.  Musculoskeletal:        General: Normal range of motion.     Cervical back: Normal range of motion and neck supple.  Skin:    General: Skin is warm and dry.     Capillary Refill: Capillary refill takes less than 2 seconds.  Neurological:     Mental Status: He is alert and oriented to person, place, and time.     ED Results / Procedures / Treatments   Labs (all labs ordered are listed, but only abnormal results are displayed) Labs Reviewed  LIPASE, BLOOD - Abnormal; Notable for the following components:      Result Value   Lipase 60 (*)    All other components within normal limits  COMPREHENSIVE METABOLIC PANEL - Abnormal; Notable for the following components:   Potassium 3.2 (*)    Glucose, Bld 100 (*)    Creatinine, Ser 1.26 (*)    Calcium 8.5 (*)    Albumin 3.0 (*)    All other components within normal limits  CBC - Abnormal; Notable for the following components:   WBC 2.3 (*)    Platelets 85 (*)    All other components within normal limits  URINALYSIS, ROUTINE W REFLEX MICROSCOPIC    EKG None  Radiology No results found.  Procedures Procedures (including critical care time)  Medications Ordered in ED Medications - No data to display  ED Course  I have reviewed the triage vital signs and the nursing notes.  Pertinent labs & imaging results that were available during my care of the patient were reviewed by me and considered in my medical decision making (see chart for details).    MDM Rules/Calculators/A&P                          Patient presenting for evaluation nausea, vomiting, Donnell pain.  On further investigation, patient's vomiting appears to be many hours after eating solid foods.  He does have a history of gallstones, although exam and history not consistent with  classic gallbladder etiology.  However he does appear well today.  will obtain labs and see further evaluation.  case discussed with attending, Dr. Sabra Heck evaluated the patient.  Labs interpreted by me, shows mild leukopenia at 2.3.  Lipase mildly elevated at 60.  Has been normal in the past.  LFTs and bili are normal.  Consider pancreatitis.  Consider choledocholithiasis.  Consider leukopenia and elevated lipase due to treatment for hepatitis.   Pt signed out to A Papier, MD for f/u on CT. If negative, plan for reassess.  Consider Po challenge and/or discussion with GI as needed.   Final Clinical Impression(s) / ED Diagnoses Final diagnoses:  None    Rx / DC Orders ED Discharge Orders    None       Franchot Heidelberg, PA-C 03/08/20 1539    Noemi Chapel, MD 03/09/20 1018

## 2020-03-08 NOTE — ED Notes (Signed)
Patient verbalizes understanding of discharge instructions. Opportunity for questioning and answers were provided. Pt discharged from ED. 

## 2020-03-08 NOTE — ED Triage Notes (Signed)
Pt here for decreased appetite, L sided abdominal pain, and nausea after eating x 3-4 days. Hx of gallstones, talked to GI doctor (Dr. Collene Mares) today who advised him to come here.

## 2020-03-09 ENCOUNTER — Other Ambulatory Visit: Payer: Self-pay | Admitting: Physician Assistant

## 2020-03-09 DIAGNOSIS — I10 Essential (primary) hypertension: Secondary | ICD-10-CM

## 2020-03-09 DIAGNOSIS — U071 COVID-19: Secondary | ICD-10-CM

## 2020-03-09 NOTE — Progress Notes (Signed)
I connected by phone with Mark Villarreal on 03/09/2020 at 2:59 PM to discuss the potential use of a new treatment for mild to moderate COVID-19 viral infection in non-hospitalized patients.  This patient is a 59 y.o. male that meets the FDA criteria for Emergency Use Authorization of COVID monoclonal antibody casirivimab/imdevimab.  Has a (+) direct SARS-CoV-2 viral test result  Has mild or moderate COVID-19   Is NOT hospitalized due to COVID-19  Is within 10 days of symptom onset  Has at least one of the high risk factor(s) for progression to severe COVID-19 and/or hospitalization as defined in EUA.  Specific high risk criteria : Cardiovascular disease or hypertension   I have spoken and communicated the following to the patient or parent/caregiver regarding COVID monoclonal antibody treatment:  1. FDA has authorized the emergency use for the treatment of mild to moderate COVID-19 in adults and pediatric patients with positive results of direct SARS-CoV-2 viral testing who are 36 years of age and older weighing at least 40 kg, and who are at high risk for progressing to severe COVID-19 and/or hospitalization.  2. The significant known and potential risks and benefits of COVID monoclonal antibody, and the extent to which such potential risks and benefits are unknown.  3. Information on available alternative treatments and the risks and benefits of those alternatives, including clinical trials.  4. Patients treated with COVID monoclonal antibody should continue to self-isolate and use infection control measures (e.g., wear mask, isolate, social distance, avoid sharing personal items, clean and disinfect "high touch" surfaces, and frequent handwashing) according to CDC guidelines.   5. The patient or parent/caregiver has the option to accept or refuse COVID monoclonal antibody treatment.  After reviewing this information with the patient, The patient agreed to proceed with receiving  casirivimab\imdevimab infusion and will be provided a copy of the Fact sheet prior to receiving the infusion.   Sx onset 7/6. Set up for infusion on 7/13 @ 8:30am. Directions given to new [facility] infusion center.   Angelena Form 03/09/2020 2:59 PM

## 2020-03-11 MED ORDER — SODIUM CHLORIDE 0.9 % IV SOLN
Freq: Once | INTRAVENOUS | Status: DC
  Filled 2020-03-11: qty 5

## 2020-03-12 ENCOUNTER — Ambulatory Visit (HOSPITAL_COMMUNITY)
Admission: RE | Admit: 2020-03-12 | Discharge: 2020-03-12 | Disposition: A | Discharge status: Home / Self Care | Payer: 59 | Source: Ambulatory Visit | Attending: Pulmonary Disease | Admitting: Pulmonary Disease

## 2020-03-13 ENCOUNTER — Other Ambulatory Visit: Payer: Self-pay | Admitting: Pharmacist

## 2020-03-13 DIAGNOSIS — B182 Chronic viral hepatitis C: Secondary | ICD-10-CM

## 2020-03-13 MED ORDER — SOFOSBUVIR-VELPATASVIR 400-100 MG PO TABS: 1.0000 | ORAL_TABLET | Freq: Every day | ORAL | 0 refills | Status: DC

## 2020-03-13 NOTE — Progress Notes (Signed)
Patient must fill his last month of Epclusa at [facility].

## 2020-04-16 ENCOUNTER — Encounter: Payer: Self-pay | Admitting: Family

## 2020-04-16 ENCOUNTER — Ambulatory Visit: Payer: 59 | Admitting: Family

## 2020-04-16 ENCOUNTER — Other Ambulatory Visit: Payer: Self-pay

## 2020-04-16 VITALS — BP 143/86 | HR 74 | Temp 98.6°F | Ht 70.0 in | Wt 198.0 lb

## 2020-04-16 DIAGNOSIS — B182 Chronic viral hepatitis C: Secondary | ICD-10-CM | POA: Insufficient documentation

## 2020-04-16 NOTE — Patient Instructions (Signed)
Nice to see you.  We will check your blood work today.  Plan for follow up lab visit in 3 months to ensure cure of Hepatitis C.  Have a great day and stay safe!

## 2020-04-16 NOTE — Progress Notes (Signed)
Subjective:    Patient ID: Mark Villarreal, male    DOB: [birth year], 59 y.o.   MRN: [id number]  Chief Complaint  Patient presents with  . Follow-up    completed epclusa     HPI:  Mark Villarreal is a 59 y.o. male with Genotype 1a Chronic Hepatitis C with initial viral load of 379,000 and fibrosis score of F4 and compensated who was last seen in the office on 02/08/20 by Sharma Covert, PharmD for follow up and found to have an undetectable RNA level. CT abdomen/pelvis completed on 7/9 with hepatic steatosis. Here today for end of treatment visit.  Mark Villarreal completed his course of Epclusa about 2 weeks ago on August 4th having good adherence and tolerance with no adverse side effects or missed doses. Feeling well today with no new concerns/complaints. Has had increased energy since starting treatment. Denies symptoms of abdominal pain, nausea, vomiting, fatigue, scleral icterus and jaundice. Continues to follow with Dr. Collene Mares of Gastroenterology.  No Known Allergies    Outpatient Medications Prior to Visit  Medication Sig Dispense Refill  . amLODipine (NORVASC) 10 MG tablet Take 20 mg by mouth at bedtime.   3  . ASPIRIN LOW DOSE 81 MG EC tablet Take 81 mg by mouth at bedtime.   3  . BYSTOLIC 20 MG TABS Take 20 mg by mouth daily.     . Probiotic Product (PROBIOTIC DAILY) CAPS Take 1 capsule by mouth daily. Ultra Flora Probiotic - prescribed by Dr. Collene Mares     . Sofosbuvir-Velpatasvir (EPCLUSA) 400-100 MG TABS Take 1 tablet by mouth daily. (Patient not taking: Reported on 04/16/2020) 28 tablet 0   No facility-administered medications prior to visit.     Past Medical History:  Diagnosis Date  . Acute kidney injury ([place])   . Chest pain 06/23/2018  . Cholelithiasis 05/2018  . Hepatic steatosis   . Hypertension      Past Surgical History:  Procedure Laterality Date  . WRIST SURGERY Right     Review of Systems  Constitutional: Negative for chills, diaphoresis,  fatigue and fever.  Respiratory: Negative for cough, chest tightness, shortness of breath and wheezing.   Cardiovascular: Negative for chest pain.  Gastrointestinal: Negative for abdominal distention, abdominal pain, constipation, diarrhea, nausea and vomiting.  Neurological: Negative for weakness and headaches.  Hematological: Does not bruise/bleed easily.      Objective:    BP (!) 143/86   Pulse 74   Temp 98.6 F (37 C) (Oral)   Ht 5\' 10"  (1.778 m)   Wt 198 lb (89.8 kg)   SpO2 100%   BMI 28.41 kg/m  Nursing note and vital signs reviewed.  Physical Exam Constitutional:      General: He is not in acute distress.    Appearance: He is well-developed.  Cardiovascular:     Rate and Rhythm: Normal rate and regular rhythm.     Heart sounds: Normal heart sounds. No murmur heard.  No friction rub. No gallop.   Pulmonary:     Effort: Pulmonary effort is normal. No respiratory distress.     Breath sounds: Normal breath sounds. No wheezing or rales.  Chest:     Chest wall: No tenderness.  Abdominal:     General: Bowel sounds are normal. There is no distension.     Palpations: Abdomen is soft. There is no mass.     Tenderness: There is no abdominal tenderness. There is no guarding or rebound.  Skin:  General: Skin is warm and dry.  Neurological:     Mental Status: He is alert and oriented to person, place, and time.  Psychiatric:        Behavior: Behavior normal.        Thought Content: Thought content normal.        Judgment: Judgment normal.      Depression screen [facility] 2/9 04/16/2020 12/22/2019  Decreased Interest 0 0  Down, Depressed, Hopeless 0 0  PHQ - 2 Score 0 0       Assessment & Plan:    Patient Active Problem List   Diagnosis Date Noted  . Chronic hepatitis C without hepatic coma ([place]) 04/16/2020  . Degeneration of cervical intervertebral disc 06/24/2018  . Hypertension   . Hepatic steatosis   . Atypical chest pain 06/23/2018  . Chest pain 06/23/2018  .  Cholelithiasis 05/31/2018     Problem List Items Addressed This Visit      Digestive   Chronic hepatitis C without hepatic coma ([place]) - Primary    Mark Villarreal is a 59 y/o male with Genotype 1a Chronic Hepatitis C with initial viral load of 379.000 and fibrosis scrore of F4 who is now s/p 12 weeks of therapy with Epclusa which was completed with good adherence and tolerance. Previous RNA level was undetectable. Will recheck RNA level for end of treatment today. He is at risk for [facility] and will need to continue with surveillance in the future. Plan for lab follow up in 3 months to confirm SVR12.      Relevant Orders   Hepatitis C RNA quantitative   Hepatitis C RNA quantitative       I have discontinued [name]'s Sofosbuvir-Velpatasvir. I am also having him maintain his Aspirin Low Dose, amLODipine, Probiotic Daily, and Bystolic.   Follow-up: Return in about 3 months (around 07/17/2020), or if symptoms worsen or fail to improve.   Terri Piedra, MSN, FNP-C Nurse Practitioner [facility] [place] number: [phone]

## 2020-04-16 NOTE — Assessment & Plan Note (Addendum)
[name] is a 59 y/o male with Genotype 1a Chronic Hepatitis C with initial viral load of 379.000 and fibrosis scrore of F4 who is now s/p 12 weeks of therapy with Epclusa which was completed with good adherence and tolerance. Previous RNA level was undetectable. Will recheck RNA level for end of treatment today. He is at risk for [facility] and will need to continue with surveillance in the future. Plan for lab follow up in 3 months to confirm SVR12.

## 2020-04-18 LAB — HEPATITIS C RNA QUANTITATIVE
HCV RNA, PCR, QN (Log): <1.18 log IU/mL
HCV RNA, PCR, QN: <15 IU/mL

## 2020-05-29 NOTE — Telephone Encounter (Signed)
error 

## 2020-06-14 ENCOUNTER — Inpatient Hospital Stay: Payer: 59

## 2020-06-14 ENCOUNTER — Other Ambulatory Visit: Payer: Self-pay

## 2020-06-14 ENCOUNTER — Inpatient Hospital Stay: Payer: 59 | Attending: Hematology | Admitting: Hematology

## 2020-06-14 ENCOUNTER — Encounter: Payer: Self-pay | Admitting: Hematology

## 2020-06-14 VITALS — BP 136/82 | HR 72 | Temp 98.7°F | Resp 17 | Ht 70.0 in | Wt 207.4 lb

## 2020-06-14 DIAGNOSIS — D696 Thrombocytopenia, unspecified: Secondary | ICD-10-CM | POA: Diagnosis not present

## 2020-06-14 DIAGNOSIS — D709 Neutropenia, unspecified: Secondary | ICD-10-CM

## 2020-06-14 DIAGNOSIS — B1920 Unspecified viral hepatitis C without hepatic coma: Secondary | ICD-10-CM

## 2020-06-14 LAB — CBC WITH DIFFERENTIAL/PLATELET
Abs Immature Granulocytes: 0 10*3/uL (ref 0.00–0.07)
Basophils Absolute: 0 10*3/uL (ref 0.0–0.1)
Basophils Relative: 1 %
Eosinophils Absolute: 0 10*3/uL (ref 0.0–0.5)
Eosinophils Relative: 1 %
HCT: 46.2 % (ref 39.0–52.0)
Hemoglobin: 14.8 g/dL (ref 13.0–17.0)
Immature Granulocytes: 0 %
Lymphocytes Relative: 47 %
Lymphs Abs: 1.6 10*3/uL (ref 0.7–4.0)
MCH: 27.5 pg (ref 26.0–34.0)
MCHC: 32 g/dL (ref 30.0–36.0)
MCV: 85.7 fL (ref 80.0–100.0)
Monocytes Absolute: 0.3 10*3/uL (ref 0.1–1.0)
Monocytes Relative: 9 %
Neutro Abs: 1.4 10*3/uL — ABNORMAL LOW (ref 1.7–7.7)
Neutrophils Relative %: 42 %
Platelets: 115 10*3/uL — ABNORMAL LOW (ref 150–400)
RBC: 5.39 MIL/uL (ref 4.22–5.81)
RDW: 14.5 % (ref 11.5–15.5)
WBC: 3.3 10*3/uL — ABNORMAL LOW (ref 4.0–10.5)
nRBC: 0 % (ref 0.0–0.2)

## 2020-06-14 LAB — CMP (CANCER CENTER ONLY)
ALT: 30 U/L (ref 0–44)
AST: 27 U/L (ref 15–41)
Albumin: 3.5 g/dL (ref 3.5–5.0)
Alkaline Phosphatase: 74 U/L (ref 38–126)
Anion gap: 4 — ABNORMAL LOW (ref 5–15)
BUN: 12 mg/dL (ref 6–20)
CO2: 30 mmol/L (ref 22–32)
Calcium: 9.3 mg/dL (ref 8.9–10.3)
Chloride: 105 mmol/L (ref 98–111)
Creatinine: 1.23 mg/dL (ref 0.61–1.24)
GFR, Estimated: >60 mL/min (ref >60)
Glucose, Bld: 124 mg/dL — ABNORMAL HIGH (ref 70–99)
Potassium: 4 mmol/L (ref 3.5–5.1)
Sodium: 139 mmol/L (ref 135–145)
Total Bilirubin: 1 mg/dL (ref 0.3–1.2)
Total Protein: 7.1 g/dL (ref 6.5–8.1)

## 2020-06-14 LAB — LACTATE DEHYDROGENASE: LDH: 189 U/L (ref 98–192)

## 2020-06-14 NOTE — Progress Notes (Signed)
HEMATOLOGY/ONCOLOGY CONSULTATION NOTE  Date of Service: 06/14/2020  Patient Care Team: Lucianne Lei, MD as PCP - General (Family Medicine)  REFERRING PHYSICIAN: Lucianne Lei, MD  CHIEF COMPLAINTS/PURPOSE OF CONSULTATION:  Thrombocytopenia    HISTORY OF PRESENTING ILLNESS:   Mark Villarreal is a wonderful 59 y.o. male who has been referred to [place] by Nelwyn Salisbury, MD for evaluation and management of Thrombocytopenia. The pt reports that he is doing well overall.   The pt reports he is good.  He has been taking amlodipine, aspirin, VSL 3 Probiotic and Ultraflora Balance Probiotic. Prior to anal fissure pt was taking acid suppressants, but they were making him constipated, super thirsty and have low appetite. He has been eating lots of banana's to keep his potassium up. Pt drinks about 1 glass of wine a night and a couple of beers on weekends. He as lost about 10 pounds over the last month or so. Pt has been walking and biking.   Most recent lab results (10/26/19) of CBC is as follows: all values are WNL except for WBC at 3.1, Platelets at 130, Glucose at 100, ALt at 133, AST at 77.  On review of systems, pt reports denies lower abdominal pains, pedal edema, pain along spine and any other symptoms.   On PMHx the pt reports concussion in 2012, fatty liver in 2019, gallstones, acid reflux, right hand tendon surgery in 1990's, left hand arthritis, high blood pressure, anal fissure, osteoarthritis in left knee   On Social Hx the pt reports some alcohol use, no smoking  INTERVAL HISTORY: Mark Villarreal is a wonderful 59 y.o. male who is here for evaluation and management of thrombocytopenia. The patient's last visit with [place] was on 12/07/2019. The pt reports that he is doing well overall.  The pt reports that he completed 12 weeks of treatment with Epclusa. He denies any issues with this treatment. Pt continues following with Dr. Elna Breslow for Hep C monitoring, who believes that pt  is in remission at this time. He did not indicate to the pt that there was any element of liver cirrhosis.   Lab results today (06/14/20) of CBC w/diff and CMP is as follows: all values are WNL except for WBC at 3.3K, PLT at 115K, Neutro Abs at 1.4K, Glucose at 124, Anion gap at 4. 06/14/2020 LDH at 189  On review of systems, pt reports dyspepsia, chronic back pain and denies abdominal pain, leg swelling and any other symptoms.    MEDICAL HISTORY:  Past Medical History:  Diagnosis Date  . Acute kidney injury ([place])   . Chest pain 06/23/2018  . Cholelithiasis 05/2018  . Hepatic steatosis   . Hypertension      SURGICAL HISTORY: Past Surgical History:  Procedure Laterality Date  . WRIST SURGERY Right      SOCIAL HISTORY: Social History   Socioeconomic History  . Marital status: Married    Spouse name: Not on file  . Number of children: Not on file  . Years of education: Not on file  . Highest education level: Not on file  Occupational History  . Not on file  Tobacco Use  . Smoking status: Never Smoker  . Smokeless tobacco: Never Used  Vaping Use  . Vaping Use: Never used  Substance and Sexual Activity  . Alcohol use: Yes    Comment: socially  . Drug use: Never  . Sexual activity: Yes  Other Topics Concern  . Not on file  Social  History Narrative  . Not on file   Social Determinants of Health   Financial Resource Strain:   . Difficulty of Paying Living Expenses: Not on file  Food Insecurity:   . Worried About Charity fundraiser in the Last Year: Not on file  . Ran Out of Food in the Last Year: Not on file  Transportation Needs:   . Lack of Transportation (Medical): Not on file  . Lack of Transportation (Non-Medical): Not on file  Physical Activity:   . Days of Exercise per Week: Not on file  . Minutes of Exercise per Session: Not on file  Stress:   . Feeling of Stress : Not on file  Social Connections:   . Frequency of Communication with Friends and  Family: Not on file  . Frequency of Social Gatherings with Friends and Family: Not on file  . Attends Religious Services: Not on file  . Active Member of Clubs or Organizations: Not on file  . Attends Archivist Meetings: Not on file  . Marital Status: Not on file  Intimate Partner Violence:   . Fear of Current or Ex-Partner: Not on file  . Emotionally Abused: Not on file  . Physically Abused: Not on file  . Sexually Abused: Not on file     FAMILY HISTORY: History reviewed. No pertinent family history.   ALLERGIES:   has No Known Allergies.   MEDICATIONS:  Current Outpatient Medications  Medication Sig Dispense Refill  . amLODipine (NORVASC) 10 MG tablet Take 20 mg by mouth at bedtime.   3  . ASPIRIN LOW DOSE 81 MG EC tablet Take 81 mg by mouth at bedtime.   3  . BYSTOLIC 20 MG TABS Take 20 mg by mouth daily.     . Probiotic Product (PROBIOTIC DAILY) CAPS Take 1 capsule by mouth daily. Ultra Flora Probiotic - prescribed by Dr. Collene Mares      No current facility-administered medications for this visit.     REVIEW OF SYSTEMS:   A 10+ POINT REVIEW OF SYSTEMS WAS OBTAINED including neurology, dermatology, psychiatry, cardiac, respiratory, lymph, extremities, GI, GU, Musculoskeletal, constitutional, breasts, reproductive, HEENT.  All pertinent positives are noted in the HPI.  All others are negative.   PHYSICAL EXAMINATION: ECOG PERFORMANCE STATUS: 1 - Symptomatic but completely ambulatory  Vitals:   06/14/20 1049  BP: 136/82  Pulse: 72  Resp: 17  Temp: 98.7 F (37.1 C)  SpO2: 100%   Filed Weights   06/14/20 1049  Weight: 207 lb 6.4 oz (94.1 kg)   Body mass index is 29.76 kg/m.   GENERAL:alert, in no acute distress and comfortable SKIN: no acute rashes, no significant lesions EYES: conjunctiva are pink and non-injected, sclera anicteric OROPHARYNX: MMM, no exudates, no oropharyngeal erythema or ulceration NECK: supple, no JVD LYMPH:  no palpable  lymphadenopathy in the cervical, axillary or inguinal regions LUNGS: clear to auscultation b/l with normal respiratory effort HEART: regular rate & rhythm ABDOMEN:  normoactive bowel sounds , non tender, not distended. No palpable hepatosplenomegaly.  Extremity: no pedal edema PSYCH: alert & oriented x 3 with fluent speech NEURO: no focal motor/sensory deficits  LABORATORY DATA:  I have reviewed the data as listed  CBC Latest Ref Rng & Units 06/14/2020 03/08/2020 12/07/2019  WBC 4.0 - 10.5 K/uL 3.3(L) 2.3(L) 2.9(L)  Hemoglobin 13.0 - 17.0 g/dL 14.8 15.2 14.2  Hematocrit 39 - 52 % 46.2 47.5 45.1  Platelets 150 - 400 K/uL 115(L) 85(L) 128(L)  ANC 1.2k . CBC    Component Value Date/Time   WBC 3.3 (L) 06/14/2020 1023   RBC 5.39 06/14/2020 1023   HGB 14.8 06/14/2020 1023   HCT 46.2 06/14/2020 1023   PLT 115 (L) 06/14/2020 1023   MCV 85.7 06/14/2020 1023   MCH 27.5 06/14/2020 1023   MCHC 32.0 06/14/2020 1023   RDW 14.5 06/14/2020 1023   LYMPHSABS 1.6 06/14/2020 1023   MONOABS 0.3 06/14/2020 1023   EOSABS 0.0 06/14/2020 1023   BASOSABS 0.0 06/14/2020 1023     CMP Latest Ref Rng & Units 06/14/2020 03/08/2020 02/08/2020  Glucose 70 - 99 mg/dL 124(H) 100(H) 113(H)  BUN 6 - 20 mg/dL 12 12 12   Creatinine 0.61 - 1.24 mg/dL 1.23 1.26(H) 1.17  Sodium 135 - 145 mmol/L 139 138 140  Potassium 3.5 - 5.1 mmol/L 4.0 3.2(L) 4.0  Chloride 98 - 111 mmol/L 105 102 104  CO2 22 - 32 mmol/L 30 25 28   Calcium 8.9 - 10.3 mg/dL 9.3 8.5(L) 9.0  Total Protein 6.5 - 8.1 g/dL 7.1 6.8 6.4  Total Bilirubin 0.3 - 1.2 mg/dL 1.0 0.9 0.5  Alkaline Phos 38 - 126 U/L 74 50 -  AST 15 - 41 U/L 27 40 23  ALT 0 - 44 U/L 30 36 21   Component     Latest Ref Rng & Units 12/07/2019  Hepatitis B Surface Ag     NON REACTIVE NON REACTIVE  Hep B Core Total Ab     NON REACTIVE NON REACTIVE  HCV Ab     NON REACTIVE Reactive (A)  Platelet CT in Citrate      EDTA platelet count consistent with citrate.  Immature  Platelet Fraction     1.2 - 8.6 % 2.7  Vitamin B12     180 - 914 pg/mL 375     RADIOGRAPHIC STUDIES: I have personally reviewed the radiological images as listed and agreed with the findings in the report. No results found.   ASSESSMENT & PLAN:  DAREAN ROTE is a 59 y.o. male with:  1. Mild thrombocytopenia PLT 128k 2. Mild neutropenia ANC 1.2k 3. Abnormal LFts - Hep C Ab +ve concerning for newly diagnosed Hepatitis C  If Hep C and possibly hep C related chronic disease confirmed this would likely be etiology of thrombocytopenia and neutropenia   PLAN: -Discussed pt labwork today, 06/14/20; WBC & PLT have improved, liver enzymes have normalized, LDH isWNL -Advised pt that Hepatitis C appears to be the main cause of his previously abnormal blood counts.  -Recommend pt continue probiotics and avoid alcohol use, spicy foods, overly salty foods, and drinking too much water with his main meals.  -Recommend pt use OTC Omeprazole once per day to see if it improves his dyspepsia symptoms.  -Advised pt to f/u with Dr. Collene Mares if dyspepsia is persistent after lifestyle modifications.  -Recommend pt f/u with Dr. Elna Breslow for Hep C monitioring -Recommend pt f/u with PCP  -Will see back as needed    FOLLOW UP RTC with PCP   The total time spent in the appt was 25 minutes and more than 50% was on counseling and direct patient cares.  All of the patient's questions were answered with apparent satisfaction. The patient knows to call the clinic with any problems, questions or concerns.    Sullivan Lone MD [place] AAHIVMS [facility] Hematology/Oncology Physician [facility]  (Office):       [phone] (Work cell):  [phone] (  Fax):           [phone]  06/14/2020 11:39 AM  I, Yevette Edwards, am acting as a scribe for Dr. Sullivan Lone.   .I have reviewed the above documentation for accuracy and completeness, and I agree with the above. Brunetta Genera MD

## 2020-07-08 ENCOUNTER — Encounter: Payer: Self-pay | Admitting: Family

## 2020-07-08 ENCOUNTER — Other Ambulatory Visit: Payer: Self-pay

## 2020-07-08 ENCOUNTER — Ambulatory Visit: Payer: 59 | Admitting: Family

## 2020-07-08 VITALS — BP 138/87 | HR 74 | Resp 16 | Ht 70.0 in | Wt 213.0 lb

## 2020-07-08 DIAGNOSIS — B182 Chronic viral hepatitis C: Secondary | ICD-10-CM | POA: Diagnosis not present

## 2020-07-08 DIAGNOSIS — K7400 Hepatic fibrosis, unspecified: Secondary | ICD-10-CM

## 2020-07-08 NOTE — Patient Instructions (Signed)
Nice to see you.  We will check your lab work today.  Recommend routine screening for liver cancer every 6-12 months due to increased fibrosis(scar tissue) of the liver.   If you need to be screened for Hepatitis C in the future, they will need to check your Hepatitis C RNA (viral load ) as the antibody test will always be positive.   No follow up is necessary if your blood work is negative.   Have a great day and stay safe!

## 2020-07-08 NOTE — Progress Notes (Signed)
Subjective:    Patient ID: Mark Villarreal, male    DOB: 12-25-1960, 59 y.o.   MRN: [id number]  Chief Complaint  Patient presents with  . Follow-up    hep c      HPI:  Mark Villarreal is a 59 y.o. male with genotype 1 a chronic hepatitis C with initial viral load of 379,000 and fibrosis score of F4 and compensated was last seen in the office on 04/16/2020 at the completion of treatment.  Viral load at the time was undetectable.  Here today for care visit.  Mark Villarreal continues to do well having completed his Raeanne Gathers prior to his most recent office visit.  Currently without symptoms and feeling well today.  Denies any abdominal pain, nausea, vomiting, scleral icterus, or jaundice.  He continues to follow with Dr. Collene Mares for gastroenterology.   No Known Allergies    Outpatient Medications Prior to Visit  Medication Sig Dispense Refill  . amLODipine (NORVASC) 10 MG tablet Take 20 mg by mouth at bedtime.   3  . ASPIRIN LOW DOSE 81 MG EC tablet Take 81 mg by mouth at bedtime.   3  . BYSTOLIC 20 MG TABS Take 20 mg by mouth daily.     . Probiotic Product (PROBIOTIC DAILY) CAPS Take 1 capsule by mouth daily. Ultra Flora Probiotic - prescribed by Dr. Collene Mares      No facility-administered medications prior to visit.     Past Medical History:  Diagnosis Date  . Acute kidney injury ([place])   . Chest pain 06/23/2018  . Cholelithiasis 05/2018  . Hepatic steatosis   . Hypertension      Past Surgical History:  Procedure Laterality Date  . WRIST SURGERY Right        Review of Systems  Constitutional: Negative for chills, diaphoresis, fatigue and fever.  Respiratory: Negative for cough, chest tightness, shortness of breath and wheezing.   Cardiovascular: Negative for chest pain.  Gastrointestinal: Negative for abdominal distention, abdominal pain, constipation, diarrhea, nausea and vomiting.  Neurological: Negative for weakness and headaches.  Hematological: Does not  bruise/bleed easily.      Objective:    BP 138/87   Pulse 74   Resp 16   Ht 5\' 10"  (1.778 m)   Wt 213 lb (96.6 kg)   SpO2 100%   BMI 30.56 kg/m  Nursing note and vital signs reviewed.  Physical Exam Constitutional:      General: He is not in acute distress.    Appearance: He is well-developed.  Cardiovascular:     Rate and Rhythm: Normal rate and regular rhythm.     Heart sounds: Normal heart sounds. No murmur heard.  No friction rub. No gallop.   Pulmonary:     Effort: Pulmonary effort is normal. No respiratory distress.     Breath sounds: Normal breath sounds. No wheezing or rales.  Chest:     Chest wall: No tenderness.  Abdominal:     General: Bowel sounds are normal. There is no distension.     Palpations: Abdomen is soft. There is no mass.     Tenderness: There is no abdominal tenderness. There is no guarding or rebound.  Skin:    General: Skin is warm and dry.  Neurological:     Mental Status: He is alert and oriented to person, place, and time.  Psychiatric:        Behavior: Behavior normal.        Thought Content: Thought content  normal.        Judgment: Judgment normal.      Depression screen [facility] 2/9 07/08/2020 04/16/2020 12/22/2019  Decreased Interest 0 0 0  Down, Depressed, Hopeless 0 0 0  PHQ - 2 Score 0 0 0       Assessment & Plan:    Patient Active Problem List   Diagnosis Date Noted  . Chronic hepatitis C without hepatic coma ([place]) 04/16/2020  . Degeneration of cervical intervertebral disc 06/24/2018  . Hypertension   . Hepatic steatosis   . Atypical chest pain 06/23/2018  . Chest pain 06/23/2018  . Cholelithiasis 05/31/2018     Problem List Items Addressed This Visit      Digestive   Chronic hepatitis C without hepatic coma ([place]) - Primary    Mark Villarreal is a 59 year old male with genotype 1 a chronic hepatitis C status post treatment with Epclusa.  Viral load at the end of treatment was undetectable.  Check blood work today to  determine sustained viremic response.  Discussed continued need for hepatocellular carcinoma screening every 6 months through primary care/gastroenterology.  If concern for hepatitis C infection occurs in the future will need to be screened with a hepatitis C RNA level as hepatitis C antibody will always be positive.  Plan for follow-up pending blood work results as needed.      Relevant Orders   Hepatitis C RNA quantitative   Hepatic function panel    Other Visit Diagnoses    Liver fibrosis       Relevant Orders   Hepatitis C RNA quantitative   Hepatic function panel       I am having Mark Villarreal maintain his Aspirin Low Dose, amLODipine, Probiotic Daily, and Bystolic.   Follow-up: Return if symptoms worsen or fail to improve.   Terri Piedra, MSN, FNP-C Nurse Practitioner [facility] for Infectious Disease [place] number: [phone]

## 2020-07-08 NOTE — Assessment & Plan Note (Signed)
Mr. Cocozza is a 59 year old male with genotype 1 a chronic hepatitis C status post treatment with Epclusa.  Viral load at the end of treatment was undetectable.  Check blood work today to determine sustained viremic response.  Discussed continued need for hepatocellular carcinoma screening every 6 months through primary care/gastroenterology.  If concern for hepatitis C infection occurs in the future will need to be screened with a hepatitis C RNA level as hepatitis C antibody will always be positive.  Plan for follow-up pending blood work results as needed.

## 2020-07-09 LAB — HEPATIC FUNCTION PANEL
AG Ratio: 1.4 (calc) (ref 1.0–2.5)
ALT: 26 U/L (ref 9–46)
AST: 24 U/L (ref 10–35)
Albumin: 4 g/dL (ref 3.6–5.1)
Alkaline phosphatase (APISO): 71 U/L (ref 35–144)
Bilirubin, Direct: 0.1 mg/dL (ref 0.0–0.2)
Globulin: 2.8 g/dL (calc) (ref 1.9–3.7)
Indirect Bilirubin: 0.4 mg/dL (calc) (ref 0.2–1.2)
Total Bilirubin: 0.5 mg/dL (ref 0.2–1.2)
Total Protein: 6.8 g/dL (ref 6.1–8.1)

## 2020-07-09 LAB — HEPATITIS C RNA QUANTITATIVE
HCV RNA, PCR, QN (Log): <1.18 log IU/mL
HCV RNA, PCR, QN: <15 IU/mL

## 2020-08-31 DIAGNOSIS — U071 COVID-19: Secondary | ICD-10-CM

## 2020-08-31 HISTORY — DX: COVID-19: U07.1

## 2022-03-09 ENCOUNTER — Other Ambulatory Visit (HOSPITAL_COMMUNITY): Payer: Self-pay | Admitting: Gastroenterology

## 2022-03-09 ENCOUNTER — Other Ambulatory Visit: Payer: Self-pay | Admitting: Gastroenterology

## 2022-03-09 DIAGNOSIS — R1084 Generalized abdominal pain: Secondary | ICD-10-CM

## 2022-03-17 ENCOUNTER — Ambulatory Visit (HOSPITAL_COMMUNITY)
Admission: RE | Admit: 2022-03-17 | Discharge: 2022-03-17 | Disposition: A | Discharge status: Home / Self Care | Payer: 59 | Source: Ambulatory Visit | Attending: Gastroenterology | Admitting: Gastroenterology

## 2022-03-17 DIAGNOSIS — R1084 Generalized abdominal pain: Secondary | ICD-10-CM | POA: Diagnosis present

## 2022-03-17 MED ORDER — IOHEXOL 300 MG/ML  SOLN
100.0000 mL | Freq: Once | INTRAMUSCULAR | Status: AC | PRN
  Administered 2022-03-17: 100 mL via INTRAVENOUS

## 2022-03-17 MED ORDER — SODIUM CHLORIDE (PF) 0.9 % IJ SOLN
INTRAMUSCULAR | Status: AC
  Filled 2022-03-17: qty 50

## 2022-03-19 ENCOUNTER — Other Ambulatory Visit: Payer: Self-pay | Admitting: Gastroenterology

## 2022-03-19 ENCOUNTER — Other Ambulatory Visit (HOSPITAL_COMMUNITY): Payer: Self-pay | Admitting: Gastroenterology

## 2022-03-19 DIAGNOSIS — R9389 Abnormal findings on diagnostic imaging of other specified body structures: Secondary | ICD-10-CM

## 2022-04-23 ENCOUNTER — Ambulatory Visit (HOSPITAL_COMMUNITY): Payer: 59

## 2022-04-23 SURGERY — Surgical Case
Anesthesia: *Unknown

## 2022-04-24 ENCOUNTER — Encounter (HOSPITAL_COMMUNITY): Payer: Self-pay | Admitting: *Deleted

## 2022-04-24 ENCOUNTER — Other Ambulatory Visit: Payer: Self-pay

## 2022-04-24 NOTE — Progress Notes (Signed)
Spoke with pt for pre-op call. Pt denies cardiac history. Pt is treated for HTN.  H & P in chart dated 03/31/22.  Shower instructions given to pt and he voiced understanding.

## 2022-04-28 ENCOUNTER — Encounter (HOSPITAL_COMMUNITY): Payer: Self-pay | Admitting: Certified Registered Nurse Anesthetist

## 2022-04-28 ENCOUNTER — Ambulatory Visit (HOSPITAL_COMMUNITY): Payer: 59 | Admitting: Certified Registered Nurse Anesthetist

## 2022-04-28 ENCOUNTER — Ambulatory Visit (HOSPITAL_COMMUNITY)
Admission: RE | Admit: 2022-04-28 | Discharge: 2022-04-28 | Disposition: A | Discharge status: Home / Self Care | Payer: 59 | Source: Ambulatory Visit | Attending: Gastroenterology | Admitting: Gastroenterology

## 2022-04-28 ENCOUNTER — Ambulatory Visit (HOSPITAL_BASED_OUTPATIENT_CLINIC_OR_DEPARTMENT_OTHER): Payer: 59 | Admitting: Certified Registered Nurse Anesthetist

## 2022-04-28 ENCOUNTER — Encounter (HOSPITAL_COMMUNITY): Admission: RE | Disposition: A | Discharge status: Home / Self Care | Payer: Self-pay | Source: Home / Self Care

## 2022-04-28 ENCOUNTER — Other Ambulatory Visit: Payer: Self-pay

## 2022-04-28 ENCOUNTER — Ambulatory Visit (HOSPITAL_COMMUNITY)
Admission: RE | Admit: 2022-04-28 | Discharge: 2022-04-28 | Disposition: A | Discharge status: Home / Self Care | Payer: 59 | Attending: Gastroenterology | Admitting: Gastroenterology

## 2022-04-28 ENCOUNTER — Encounter (HOSPITAL_COMMUNITY): Payer: Self-pay

## 2022-04-28 DIAGNOSIS — R160 Hepatomegaly, not elsewhere classified: Secondary | ICD-10-CM | POA: Insufficient documentation

## 2022-04-28 DIAGNOSIS — M1990 Unspecified osteoarthritis, unspecified site: Secondary | ICD-10-CM | POA: Insufficient documentation

## 2022-04-28 DIAGNOSIS — R935 Abnormal findings on diagnostic imaging of other abdominal regions, including retroperitoneum: Secondary | ICD-10-CM | POA: Diagnosis not present

## 2022-04-28 DIAGNOSIS — B1920 Unspecified viral hepatitis C without hepatic coma: Secondary | ICD-10-CM | POA: Diagnosis not present

## 2022-04-28 DIAGNOSIS — R9389 Abnormal findings on diagnostic imaging of other specified body structures: Secondary | ICD-10-CM

## 2022-04-28 DIAGNOSIS — K219 Gastro-esophageal reflux disease without esophagitis: Secondary | ICD-10-CM | POA: Insufficient documentation

## 2022-04-28 DIAGNOSIS — I10 Essential (primary) hypertension: Secondary | ICD-10-CM

## 2022-04-28 DIAGNOSIS — Z79899 Other long term (current) drug therapy: Secondary | ICD-10-CM | POA: Diagnosis not present

## 2022-04-28 DIAGNOSIS — J90 Pleural effusion, not elsewhere classified: Secondary | ICD-10-CM | POA: Diagnosis not present

## 2022-04-28 HISTORY — DX: Gastro-esophageal reflux disease without esophagitis: K21.9

## 2022-04-28 HISTORY — DX: Unspecified osteoarthritis, unspecified site: M19.90

## 2022-04-28 HISTORY — PX: RADIOLOGY WITH ANESTHESIA: SHX6223

## 2022-04-28 LAB — COMPREHENSIVE METABOLIC PANEL
ALT: 98 U/L — ABNORMAL HIGH (ref 0–44)
AST: 95 U/L — ABNORMAL HIGH (ref 15–41)
Albumin: 2.6 g/dL — ABNORMAL LOW (ref 3.5–5.0)
Alkaline Phosphatase: 80 U/L (ref 38–126)
Anion gap: 11 (ref 5–15)
BUN: 9 mg/dL (ref 6–20)
CO2: 24 mmol/L (ref 22–32)
Calcium: 9.2 mg/dL (ref 8.9–10.3)
Chloride: 106 mmol/L (ref 98–111)
Creatinine, Ser: 1.3 mg/dL — ABNORMAL HIGH (ref 0.61–1.24)
GFR, Estimated: >60 mL/min (ref >60)
Glucose, Bld: 98 mg/dL (ref 70–99)
Potassium: 3.4 mmol/L — ABNORMAL LOW (ref 3.5–5.1)
Sodium: 141 mmol/L (ref 135–145)
Total Bilirubin: 1 mg/dL (ref 0.3–1.2)
Total Protein: 6.6 g/dL (ref 6.5–8.1)

## 2022-04-28 SURGERY — MRI WITH ANESTHESIA
Anesthesia: General

## 2022-04-28 MED ORDER — PROPOFOL 10 MG/ML IV BOLUS
INTRAVENOUS | Status: DC | PRN
  Administered 2022-04-28 (×3): 10 mg via INTRAVENOUS

## 2022-04-28 MED ORDER — LACTATED RINGERS IV SOLN: INTRAVENOUS | Status: DC

## 2022-04-28 MED ORDER — MIDAZOLAM HCL 2 MG/2ML IJ SOLN
INTRAMUSCULAR | Status: DC | PRN
  Administered 2022-04-28: 2 mg via INTRAVENOUS

## 2022-04-28 MED ORDER — ONDANSETRON HCL 4 MG/2ML IJ SOLN
INTRAMUSCULAR | Status: DC | PRN
  Administered 2022-04-28: 4 mg via INTRAVENOUS

## 2022-04-28 MED ORDER — GADOBUTROL 1 MMOL/ML IV SOLN
7.5000 mL | Freq: Once | INTRAVENOUS | Status: AC | PRN
  Administered 2022-04-28: 7.5 mL via INTRAVENOUS

## 2022-04-28 MED ORDER — ACETAMINOPHEN 500 MG PO TABS
1000.0000 mg | ORAL_TABLET | Freq: Once | ORAL | Status: DC
  Filled 2022-04-28: qty 2

## 2022-04-28 MED ORDER — FENTANYL CITRATE (PF) 250 MCG/5ML IJ SOLN
INTRAMUSCULAR | Status: DC | PRN
  Administered 2022-04-28 (×4): 25 ug via INTRAVENOUS

## 2022-04-28 MED ORDER — ORAL CARE MOUTH RINSE: 15.0000 mL | Freq: Once | OROMUCOSAL | Status: AC

## 2022-04-28 MED ORDER — CHLORHEXIDINE GLUCONATE 0.12 % MT SOLN
15.0000 mL | Freq: Once | OROMUCOSAL | Status: AC
  Administered 2022-04-28: 15 mL via OROMUCOSAL
  Filled 2022-04-28: qty 15

## 2022-04-28 NOTE — Anesthesia Preprocedure Evaluation (Addendum)
Anesthesia Evaluation  Patient identified by MRN, date of birth, ID band Patient awake    Reviewed: Allergy & Precautions, NPO status , Patient's Chart, lab work & pertinent test results  Airway Mallampati: II  TM Distance: >3 FB Neck ROM: Full    Dental   Pulmonary neg pulmonary ROS,    breath sounds clear to auscultation       Cardiovascular hypertension, Pt. on medications  Rhythm:Regular Rate:Normal     Neuro/Psych negative neurological ROS     GI/Hepatic GERD  ,(+) Hepatitis -, C  Endo/Other    Renal/GU Renal disease     Musculoskeletal  (+) Arthritis ,   Abdominal   Peds  Hematology   Anesthesia Other Findings   Reproductive/Obstetrics                             Lab Results  Component Value Date   WBC 3.3 (L) 06/14/2020   HGB 14.8 06/14/2020   HCT 46.2 06/14/2020   MCV 85.7 06/14/2020   PLT 115 (L) 06/14/2020   Lab Results  Component Value Date   CREATININE 1.23 06/14/2020   BUN 12 06/14/2020   NA 139 06/14/2020   K 4.0 06/14/2020   CL 105 06/14/2020   CO2 30 06/14/2020    Anesthesia Physical Anesthesia Plan  ASA: 2  Anesthesia Plan: MAC   Post-op Pain Management: Minimal or no pain anticipated and Tylenol PO (pre-op)*   Induction: Intravenous  PONV Risk Score and Plan: 2 and Ondansetron, Treatment may vary due to age or medical condition and Propofol infusion  Airway Management Planned: Natural Airway and Simple Face Mask  Additional Equipment:   Intra-op Plan:   Post-operative Plan:   Informed Consent: I have reviewed the patients History and Physical, chart, labs and discussed the procedure including the risks, benefits and alternatives for the proposed anesthesia with the patient or authorized representative who has indicated his/her understanding and acceptance.     Dental advisory given  Plan Discussed with: CRNA  Anesthesia Plan Comments:         Anesthesia Quick Evaluation

## 2022-04-28 NOTE — Addendum Note (Signed)
Addendum  created 04/28/22 1352 by Betha Loa, CRNA   Flowsheet accepted, Intraprocedure Flowsheets edited, Intraprocedure Meds edited

## 2022-04-28 NOTE — Anesthesia Postprocedure Evaluation (Signed)
Anesthesia Post Note  Patient: Mark Villarreal  Procedure(s) Performed: MRI abdomen with and without contrast WITH ANESTHESIA     Patient location during evaluation: PACU Anesthesia Type: MAC Level of consciousness: awake and alert Pain management: pain level controlled Vital Signs Assessment: post-procedure vital signs reviewed and stable Respiratory status: spontaneous breathing, nonlabored ventilation, respiratory function stable and patient connected to nasal cannula oxygen Cardiovascular status: stable and blood pressure returned to baseline Postop Assessment: no apparent nausea or vomiting Anesthetic complications: no   No notable events documented.  Last Vitals:  Vitals:   04/28/22 1115 04/28/22 1130  BP: 122/80 117/81  Pulse: 69 73  Resp: 14 18  Temp:  36.7 C  SpO2: 93% 95%    Last Pain:  Vitals:   04/28/22 1130  TempSrc:   PainSc: 0-No pain                 Belenda Cruise P Cayleen Benjamin

## 2022-04-28 NOTE — Transfer of Care (Signed)
Immediate Anesthesia Transfer of Care Note  Patient: Mark Villarreal  Procedure(s) Performed: MRI abdomen with and without contrast WITH ANESTHESIA  Patient Location: PACU  Anesthesia Type:MAC  Level of Consciousness: awake, alert , oriented, patient cooperative and responds to stimulation  Airway & Oxygen Therapy: Patient Spontanous Breathing  Post-op Assessment: Report given to RN, Post -op Vital signs reviewed and stable and Patient moving all extremities X 4  Post vital signs: Reviewed and stable  Last Vitals:  Vitals Value Taken Time  BP 117/78 04/28/22 1100  Temp    Pulse 72 04/28/22 1100  Resp 15 04/28/22 1100  SpO2 95 % 04/28/22 1100  Vitals shown include unvalidated device data.  Last Pain:  Vitals:   04/28/22 0833  TempSrc:   PainSc: 0-No pain         Complications: No notable events documented.

## 2022-04-29 ENCOUNTER — Encounter (HOSPITAL_COMMUNITY): Payer: Self-pay | Admitting: Radiology

## 2022-05-14 NOTE — Progress Notes (Signed)
We received a referral from Dr Collene Mares for Mark Villarreal.  He declined to make a consultation appt with [place] and stated he would call us back to make the appt. I sent Dr Collene Mares an in basket message letting her know.

## 2022-06-10 ENCOUNTER — Other Ambulatory Visit: Payer: Self-pay

## 2022-06-10 ENCOUNTER — Inpatient Hospital Stay (HOSPITAL_COMMUNITY)
Admission: EM | Admit: 2022-06-10 | Discharge: 2022-06-16 | DRG: 435 | Disposition: A | Discharge status: Home / Self Care | Payer: 59 | Attending: Internal Medicine | Admitting: Internal Medicine

## 2022-06-10 ENCOUNTER — Emergency Department (HOSPITAL_COMMUNITY): Payer: 59

## 2022-06-10 ENCOUNTER — Encounter (HOSPITAL_COMMUNITY): Payer: Self-pay

## 2022-06-10 DIAGNOSIS — N179 Acute kidney failure, unspecified: Secondary | ICD-10-CM | POA: Diagnosis present

## 2022-06-10 DIAGNOSIS — K219 Gastro-esophageal reflux disease without esophagitis: Secondary | ICD-10-CM | POA: Diagnosis present

## 2022-06-10 DIAGNOSIS — N200 Calculus of kidney: Secondary | ICD-10-CM | POA: Diagnosis present

## 2022-06-10 DIAGNOSIS — Z7982 Long term (current) use of aspirin: Secondary | ICD-10-CM

## 2022-06-10 DIAGNOSIS — C220 Liver cell carcinoma: Secondary | ICD-10-CM | POA: Diagnosis not present

## 2022-06-10 DIAGNOSIS — K7460 Unspecified cirrhosis of liver: Secondary | ICD-10-CM | POA: Diagnosis present

## 2022-06-10 DIAGNOSIS — R180 Malignant ascites: Secondary | ICD-10-CM | POA: Diagnosis present

## 2022-06-10 DIAGNOSIS — D62 Acute posthemorrhagic anemia: Secondary | ICD-10-CM | POA: Diagnosis present

## 2022-06-10 DIAGNOSIS — J948 Other specified pleural conditions: Secondary | ICD-10-CM | POA: Diagnosis present

## 2022-06-10 DIAGNOSIS — Z79899 Other long term (current) drug therapy: Secondary | ICD-10-CM

## 2022-06-10 DIAGNOSIS — R1084 Generalized abdominal pain: Principal | ICD-10-CM

## 2022-06-10 DIAGNOSIS — D649 Anemia, unspecified: Secondary | ICD-10-CM

## 2022-06-10 DIAGNOSIS — R160 Hepatomegaly, not elsewhere classified: Secondary | ICD-10-CM | POA: Diagnosis present

## 2022-06-10 DIAGNOSIS — I871 Compression of vein: Secondary | ICD-10-CM | POA: Diagnosis present

## 2022-06-10 DIAGNOSIS — K661 Hemoperitoneum: Secondary | ICD-10-CM | POA: Diagnosis present

## 2022-06-10 DIAGNOSIS — R55 Syncope and collapse: Secondary | ICD-10-CM | POA: Diagnosis present

## 2022-06-10 DIAGNOSIS — Z8619 Personal history of other infectious and parasitic diseases: Secondary | ICD-10-CM

## 2022-06-10 DIAGNOSIS — I10 Essential (primary) hypertension: Secondary | ICD-10-CM | POA: Diagnosis present

## 2022-06-10 DIAGNOSIS — R195 Other fecal abnormalities: Secondary | ICD-10-CM | POA: Diagnosis present

## 2022-06-10 DIAGNOSIS — J310 Chronic rhinitis: Secondary | ICD-10-CM | POA: Diagnosis present

## 2022-06-10 DIAGNOSIS — R7401 Elevation of levels of liver transaminase levels: Secondary | ICD-10-CM

## 2022-06-10 DIAGNOSIS — Z8616 Personal history of COVID-19: Secondary | ICD-10-CM

## 2022-06-10 DIAGNOSIS — B182 Chronic viral hepatitis C: Secondary | ICD-10-CM | POA: Diagnosis present

## 2022-06-10 DIAGNOSIS — K760 Fatty (change of) liver, not elsewhere classified: Secondary | ICD-10-CM | POA: Diagnosis present

## 2022-06-10 DIAGNOSIS — D75839 Thrombocytosis, unspecified: Secondary | ICD-10-CM

## 2022-06-10 DIAGNOSIS — E860 Dehydration: Secondary | ICD-10-CM | POA: Diagnosis present

## 2022-06-10 DIAGNOSIS — J910 Malignant pleural effusion: Secondary | ICD-10-CM | POA: Diagnosis present

## 2022-06-10 DIAGNOSIS — D75838 Other thrombocytosis: Secondary | ICD-10-CM | POA: Diagnosis present

## 2022-06-10 LAB — CBC WITH DIFFERENTIAL/PLATELET
Abs Immature Granulocytes: 0.07 10*3/uL (ref 0.00–0.07)
Basophils Absolute: 0 10*3/uL (ref 0.0–0.1)
Basophils Relative: 0 %
Eosinophils Absolute: 0 10*3/uL (ref 0.0–0.5)
Eosinophils Relative: 0 %
HCT: 30.7 % — ABNORMAL LOW (ref 39.0–52.0)
Hemoglobin: 10 g/dL — ABNORMAL LOW (ref 13.0–17.0)
Immature Granulocytes: 1 %
Lymphocytes Relative: 12 %
Lymphs Abs: 0.9 10*3/uL (ref 0.7–4.0)
MCH: 26.9 pg (ref 26.0–34.0)
MCHC: 32.6 g/dL (ref 30.0–36.0)
MCV: 82.5 fL (ref 80.0–100.0)
Monocytes Absolute: 0.5 10*3/uL (ref 0.1–1.0)
Monocytes Relative: 7 %
Neutro Abs: 6.5 10*3/uL (ref 1.7–7.7)
Neutrophils Relative %: 80 %
Platelets: 567 10*3/uL — ABNORMAL HIGH (ref 150–400)
RBC: 3.72 MIL/uL — ABNORMAL LOW (ref 4.22–5.81)
RDW: 18.7 % — ABNORMAL HIGH (ref 11.5–15.5)
WBC: 8.1 10*3/uL (ref 4.0–10.5)
nRBC: 0 % (ref 0.0–0.2)

## 2022-06-10 LAB — COMPREHENSIVE METABOLIC PANEL
ALT: 123 U/L — ABNORMAL HIGH (ref 0–44)
AST: 97 U/L — ABNORMAL HIGH (ref 15–41)
Albumin: 2.4 g/dL — ABNORMAL LOW (ref 3.5–5.0)
Alkaline Phosphatase: 66 U/L (ref 38–126)
Anion gap: 13 (ref 5–15)
BUN: 16 mg/dL (ref 6–20)
CO2: 24 mmol/L (ref 22–32)
Calcium: 9.2 mg/dL (ref 8.9–10.3)
Chloride: 98 mmol/L (ref 98–111)
Creatinine, Ser: 1.93 mg/dL — ABNORMAL HIGH (ref 0.61–1.24)
GFR, Estimated: 39 mL/min — ABNORMAL LOW (ref >60)
Glucose, Bld: 126 mg/dL — ABNORMAL HIGH (ref 70–99)
Potassium: 4.5 mmol/L (ref 3.5–5.1)
Sodium: 135 mmol/L (ref 135–145)
Total Bilirubin: 1.5 mg/dL — ABNORMAL HIGH (ref 0.3–1.2)
Total Protein: 6.2 g/dL — ABNORMAL LOW (ref 6.5–8.1)

## 2022-06-10 LAB — URINALYSIS, ROUTINE W REFLEX MICROSCOPIC
Bacteria, UA: NONE SEEN
Bilirubin Urine: NEGATIVE
Glucose, UA: NEGATIVE mg/dL
Hgb urine dipstick: NEGATIVE
Ketones, ur: 5 mg/dL — AB
Leukocytes,Ua: NEGATIVE
Nitrite: NEGATIVE
Protein, ur: 30 mg/dL — AB
Specific Gravity, Urine: 1.026 (ref 1.005–1.030)
pH: 5 (ref 5.0–8.0)

## 2022-06-10 LAB — LIPASE, BLOOD: Lipase: 35 U/L (ref 11–51)

## 2022-06-10 LAB — TROPONIN I (HIGH SENSITIVITY): Troponin I (High Sensitivity): 8 ng/L (ref <18)

## 2022-06-10 MED ORDER — ONDANSETRON 4 MG PO TBDP: 4.0000 mg | ORAL_TABLET | Freq: Once | ORAL | Status: DC

## 2022-06-10 NOTE — ED Provider Triage Note (Signed)
Emergency Medicine Provider Triage Evaluation Note  Mark Villarreal , a 61 y.o. male  was evaluated in triage.  Pt complains of near syncope, shortness of breath, abdominal pain, nausea, vomiting, diarrhea onset prior to arrival.  Patient reports near syncope.  Denies chest pain.  Patient with recent imaging that shows concern for primary hepatocellular carcinoma but patient has not seen oncology.  Review of Systems  Positive: Shortness of breath, abdominal pain, lightheadedness Negative: Fever, urinary symptoms, bloody stools  Physical Exam  BP 113/82 (BP Location: Left Arm)   Pulse 98   Temp 97.7 F (36.5 C) (Oral)   Resp 18   Ht '5\' 10"'$  (1.778 m)   Wt 83 kg   SpO2 98%   BMI 26.26 kg/m  Gen:   Awake, no distress   Resp:  Normal effort  MSK:   Moves extremities without difficulty  Other:  Patient with diffuse abdominal pain and distention.  Heart regular rate and rhythm.  Lungs clear to auscultation bilaterally.  Patient with scleral icterus noted.  Medical Decision Making  Medically screening exam initiated at 3:14 PM.  Appropriate orders placed.  [name] was informed that the remainder of the evaluation will be completed by another provider, this initial triage assessment does not replace that evaluation, and the importance of remaining in the ED until their evaluation is complete.  Patient presents for abdominal pain, shortness of breath, near syncope.  Recent diagnosis of primary hepatocellular carcinoma.  Patient vital signs reassuring.  Lab work and imaging pending at this time.   Doristine Devoid, PA-C 06/10/22 1515

## 2022-06-10 NOTE — ED Triage Notes (Signed)
Pt reports weakness and hot flashes that started today when he was at work associated with near syncope, SOB, abd pain and diarrhea.

## 2022-06-11 ENCOUNTER — Emergency Department (HOSPITAL_COMMUNITY): Payer: 59

## 2022-06-11 ENCOUNTER — Other Ambulatory Visit: Payer: Self-pay | Admitting: Hematology and Oncology

## 2022-06-11 ENCOUNTER — Encounter (HOSPITAL_COMMUNITY): Payer: Self-pay | Admitting: Family Medicine

## 2022-06-11 DIAGNOSIS — K219 Gastro-esophageal reflux disease without esophagitis: Secondary | ICD-10-CM | POA: Diagnosis present

## 2022-06-11 DIAGNOSIS — R160 Hepatomegaly, not elsewhere classified: Secondary | ICD-10-CM

## 2022-06-11 DIAGNOSIS — D75839 Thrombocytosis, unspecified: Secondary | ICD-10-CM

## 2022-06-11 DIAGNOSIS — R7401 Elevation of levels of liver transaminase levels: Secondary | ICD-10-CM

## 2022-06-11 DIAGNOSIS — N179 Acute kidney failure, unspecified: Secondary | ICD-10-CM

## 2022-06-11 LAB — URINALYSIS, ROUTINE W REFLEX MICROSCOPIC
Bilirubin Urine: NEGATIVE
Glucose, UA: NEGATIVE mg/dL
Hgb urine dipstick: NEGATIVE
Ketones, ur: NEGATIVE mg/dL
Leukocytes,Ua: NEGATIVE
Nitrite: NEGATIVE
Protein, ur: NEGATIVE mg/dL
Specific Gravity, Urine: >1.046 — ABNORMAL HIGH (ref 1.005–1.030)
pH: 5 (ref 5.0–8.0)

## 2022-06-11 LAB — TROPONIN I (HIGH SENSITIVITY): Troponin I (High Sensitivity): 12 ng/L (ref <18)

## 2022-06-11 LAB — FERRITIN: Ferritin: 465 ng/mL — ABNORMAL HIGH (ref 24–336)

## 2022-06-11 LAB — IRON AND TIBC
Iron: 42 ug/dL — ABNORMAL LOW (ref 45–182)
Saturation Ratios: 14 % — ABNORMAL LOW (ref 17.9–39.5)
TIBC: 295 ug/dL (ref 250–450)
UIBC: 253 ug/dL

## 2022-06-11 LAB — CBC
HCT: 25.4 % — ABNORMAL LOW (ref 39.0–52.0)
Hemoglobin: 8.7 g/dL — ABNORMAL LOW (ref 13.0–17.0)
MCH: 27.7 pg (ref 26.0–34.0)
MCHC: 34.3 g/dL (ref 30.0–36.0)
MCV: 80.9 fL (ref 80.0–100.0)
Platelets: 415 10*3/uL — ABNORMAL HIGH (ref 150–400)
RBC: 3.14 MIL/uL — ABNORMAL LOW (ref 4.22–5.81)
RDW: 18.5 % — ABNORMAL HIGH (ref 11.5–15.5)
WBC: 5.7 10*3/uL (ref 4.0–10.5)
nRBC: 0 % (ref 0.0–0.2)

## 2022-06-11 LAB — HIV ANTIBODY (ROUTINE TESTING W REFLEX): HIV Screen 4th Generation wRfx: NONREACTIVE

## 2022-06-11 LAB — PROTIME-INR
INR: 1.1 (ref 0.8–1.2)
Prothrombin Time: 14.5 seconds (ref 11.4–15.2)

## 2022-06-11 LAB — SODIUM, URINE, RANDOM: Sodium, Ur: <10 mmol/L

## 2022-06-11 LAB — ABO/RH: ABO/RH(D): A POS

## 2022-06-11 LAB — CREATININE, URINE, RANDOM: Creatinine, Urine: 302 mg/dL

## 2022-06-11 LAB — POC OCCULT BLOOD, ED: Fecal Occult Bld: NEGATIVE

## 2022-06-11 MED ORDER — SODIUM CHLORIDE 0.9% FLUSH
3.0000 mL | Freq: Two times a day (BID) | INTRAVENOUS | Status: DC
  Administered 2022-06-11 – 2022-06-16 (×6): 3 mL via INTRAVENOUS

## 2022-06-11 MED ORDER — SODIUM CHLORIDE 0.9 % IV SOLN: INTRAVENOUS | Status: AC

## 2022-06-11 MED ORDER — FENTANYL CITRATE PF 50 MCG/ML IJ SOSY
25.0000 ug | PREFILLED_SYRINGE | Freq: Once | INTRAMUSCULAR | Status: DC
  Filled 2022-06-11: qty 1

## 2022-06-11 MED ORDER — ONDANSETRON HCL 4 MG/2ML IJ SOLN: 4.0000 mg | Freq: Four times a day (QID) | INTRAMUSCULAR | Status: DC | PRN

## 2022-06-11 MED ORDER — SODIUM CHLORIDE 0.9 % IV SOLN: 250.0000 mL | INTRAVENOUS | Status: DC | PRN

## 2022-06-11 MED ORDER — ONDANSETRON HCL 4 MG PO TABS: 4.0000 mg | ORAL_TABLET | Freq: Four times a day (QID) | ORAL | Status: DC | PRN

## 2022-06-11 MED ORDER — SODIUM CHLORIDE 0.9% FLUSH: 3.0000 mL | INTRAVENOUS | Status: DC | PRN

## 2022-06-11 MED ORDER — IOHEXOL 350 MG/ML SOLN
50.0000 mL | Freq: Once | INTRAVENOUS | Status: AC | PRN
  Administered 2022-06-11: 50 mL via INTRAVENOUS

## 2022-06-11 MED ORDER — TRAMADOL HCL 50 MG PO TABS: 50.0000 mg | ORAL_TABLET | Freq: Three times a day (TID) | ORAL | Status: DC | PRN

## 2022-06-11 NOTE — Consult Note (Signed)
[place] NOTE  Patient Care Team: Lucianne Lei, MD as PCP - General (Family Medicine)  ASSESSMENT & PLAN:  Large hepatic masses, HCC versus biliary tract cancer versus metastatic cancer to the liver Alpha-fetoprotein is pending Recommend CT imaging of the chest to complete staging If alpha-fetoprotein is greater than 500, this is almost diagnostic for diagnosis of hepatocellular carcinoma However, if alpha-fetoprotein is within normal range, then I recommend further investigation with ultrasound guided liver biopsy We will order CA 19-9 for tomorrow  I explained the implication of diagnosis of unresectable malignancy with the patient and his fiance I told the patient he cannot ignore the problem any further I will get Dr. Irene Limbo to follow-up on him tomorrow  Acute on chronic anemia Iron studies are pending I suspect this could be related to anemia of chronic illness due to untreated cancer and renal failure  Acute on chronic renal failure Due to dehydration Recommend IV fluid support I will order CT imaging of the chest tomorrow, preferably with IV contrast if his creatinine improves  Goals of care discussion If his blood counts stabilized tomorrow, he can be discharged I will sign off I will inform Dr. Irene Limbo of his admission for future follow-up and plan of care   The total time spent in the appointment was 80 minutes encounter with patients including review of chart and various tests results, discussions about plan of care and coordination of care plan   All questions were answered. The patient knows to call the clinic with any problems, questions or concerns. No barriers to learning was detected.  Mark Lark, MD 10/12/20234:41 PM  CHIEF COMPLAINTS/PURPOSE OF CONSULTATION:  Multiple liver masses, suspect hepatocellular carcinoma, on background history of hepatitis C and liver fibrosis  HISTORY OF PRESENTING ILLNESS:  Mark Villarreal 61 y.o.  male is seen at the request of primary service The patient was seen by Dr. Irene Limbo in 2021 for thrombocytopenia He is known to have liver cirrhosis and history of hepatitis C He was seen by GI service due to abdominal bloating and right upper quadrant discomfort He underwent CT imaging on March 17, 2022 which showed significant abnormalities including a possibility of cancer.  Lymphadenopathy is also seen Subsequently, his gastroenterologist ordered MRI of the liver on April 28, 2022, results are highly suggestive of primary hepatic neoplasm/multifocal hepatocellular carcinoma.  It appears that the patient has right sided pleural effusion Referral was made to the cancer center.  When the GI navigator contacted the patient on September 14, the patient declined consultation. He is seen in the emergency department.  His girlfriend is by the bedside.  The patient stated he declined a consultation because he needs time to process the information  Today, he presented to the emergency department complaining of abdominal pain and weakness He has not been eating well due to right upper quadrant discomfort, early satiety and nausea  Imaging study was repeated which show consistent findings with multiple hepatic metastasis along with new ascites.  There is also concern that the patient might have recent bleeding from a liver lesion or acute hemorrhage from necrotic tumor.  He denies excessive pain at the time of examination He denies recent weight loss.  He denies shortness of breath The imaging finding results were reviewed with the patient  MEDICAL HISTORY:  Past Medical History:  Diagnosis Date   Acute kidney injury ([place])    Arthritis    knee and neck   Chest pain 06/23/2018  Cholelithiasis 05/2018   COVID 2022   mild case   GERD (gastroesophageal reflux disease)    Hepatic steatosis    Hypertension     SURGICAL HISTORY: Past Surgical History:  Procedure Laterality Date   COLONOSCOPY      RADIOLOGY WITH ANESTHESIA N/A 04/28/2022   Procedure: MRI abdomen with and without contrast WITH ANESTHESIA;  Surgeon: Radiologist, Medication, MD;  Location: [facility];  Service: Radiology;  Laterality: N/A;   WRIST SURGERY Right     SOCIAL HISTORY: Social History   Socioeconomic History   Marital status: Married    Spouse name: Not on file   Number of children: Not on file   Years of education: Not on file   Highest education level: Not on file  Occupational History   Not on file  Tobacco Use   Smoking status: Never   Smokeless tobacco: Never  Vaping Use   Vaping Use: Never used  Substance and Sexual Activity   Alcohol use: Yes    Comment: socially   Drug use: Never   Sexual activity: Yes  Other Topics Concern   Not on file  Social History Narrative   Not on file   Social Determinants of Health   Financial Resource Strain: Not on file  Food Insecurity: Not on file  Transportation Needs: Not on file  Physical Activity: Not on file  Stress: Not on file  Social Connections: Not on file  Intimate Partner Violence: Not on file    FAMILY HISTORY: No family history on file.  ALLERGIES:  has No Known Allergies.  MEDICATIONS:  Current Facility-Administered Medications  Medication Dose Route Frequency Provider Last Rate Last Admin   0.9 %  sodium chloride infusion  250 mL Intravenous PRN Orma Flaming, MD       fentaNYL (SUBLIMAZE) injection 25 mcg  25 mcg Intravenous Once Sharyn Lull A, PA-C       ondansetron [facility]) tablet 4 mg  4 mg Oral Q6H PRN Orma Flaming, MD       Or   ondansetron [facility]) injection 4 mg  4 mg Intravenous Q6H PRN Orma Flaming, MD       ondansetron (ZOFRAN-ODT) disintegrating tablet 4 mg  4 mg Oral Once Leaphart, Kenneth T, PA-C       sodium chloride flush (NS) 0.9 % injection 3 mL  3 mL Intravenous Q12H Orma Flaming, MD   3 mL at 06/11/22 1533   sodium chloride flush (NS) 0.9 % injection 3 mL  3 mL Intravenous PRN Orma Flaming, MD        traMADol Veatrice [facility]bon) tablet 50 mg  50 mg Oral Q8H PRN Orma Flaming, MD       Current Outpatient Medications  Medication Sig Dispense Refill   amLODipine (NORVASC) 10 MG tablet Take 10 mg by mouth at bedtime.  3   ASPIRIN LOW DOSE 81 MG EC tablet Take 81 mg by mouth at bedtime.   3   BYSTOLIC 20 MG TABS Take 20 mg by mouth daily.      Probiotic Product (PROBIOTIC DAILY) CAPS Take 1 capsule by mouth daily. Ultra Flora Probiotic - prescribed by Dr. Collene Mares       REVIEW OF SYSTEMS:   Constitutional: Denies fevers, chills or abnormal night sweats Eyes: Denies blurriness of vision, double vision or watery eyes Ears, nose, mouth, throat, and face: Denies mucositis or sore throat Respiratory: Denies cough, dyspnea or wheezes Cardiovascular: Denies palpitation, chest discomfort or lower extremity swelling Skin: Denies abnormal  skin rashes Lymphatics: Denies new lymphadenopathy or easy bruising Neurological:Denies numbness, tingling or new weaknesses Behavioral/Psych: Mood is stable, no new changes  All other systems were reviewed with the patient and are negative.  PHYSICAL EXAMINATION: ECOG PERFORMANCE STATUS: 2 - Symptomatic, <50% confined to bed  Vitals:   06/11/22 0957 06/11/22 1533  BP: 109/74   Pulse: 91   Resp: 16   Temp: 98.5 F (36.9 C) 98.4 F (36.9 C)  SpO2: 98%    Filed Weights   06/10/22 1512  Weight: 183 lb (83 kg)    GENERAL:alert, no distress and comfortable SKIN: skin color, texture, turgor are normal, no rashes or significant lesions EYES: normal, conjunctiva are pink and non-injected, sclera clear OROPHARYNX:no exudate, no erythema and lips, buccal mucosa, and tongue normal  NECK: supple, thyroid normal size, non-tender, without nodularity LYMPH:  no palpable lymphadenopathy in the cervical, axillary or inguinal LUNGS: clear to auscultation and percussion with normal breathing effort HEART: regular rate & rhythm and no murmurs and no lower extremity  edema ABDOMEN:abdomen soft, non-tender and normal bowel sounds.  Mildly distended with presence of fluid wave Musculoskeletal:no cyanosis of digits and no clubbing  PSYCH: alert & oriented x 3 with fluent speech NEURO: no focal motor/sensory deficits  LABORATORY DATA:  I have reviewed the data as listed Lab Results  Component Value Date   WBC 5.7 06/11/2022   HGB 8.7 (L) 06/11/2022   HCT 25.4 (L) 06/11/2022   MCV 80.9 06/11/2022   PLT 415 (H) 06/11/2022   Recent Labs    04/28/22 0851 06/10/22 1529  NA 141 135  K 3.4* 4.5  CL 106 98  CO2 24 24  GLUCOSE 98 126*  BUN 9 16  CREATININE 1.30* 1.93*  CALCIUM 9.2 9.2  GFRNONAA >60 39*  PROT 6.6 6.2*  ALBUMIN 2.6* 2.4*  AST 95* 97*  ALT 98* 123*  ALKPHOS 80 66  BILITOT 1.0 1.5*    RADIOGRAPHIC STUDIES: I have personally reviewed the radiological images as listed and agreed with the findings in the report. CT ABDOMEN PELVIS W CONTRAST  Result Date: 06/11/2022 CLINICAL DATA:  Abdominal pain.  Known hepatic masses. EXAM: CT ABDOMEN AND PELVIS WITH CONTRAST TECHNIQUE: Multidetector CT imaging of the abdomen and pelvis was performed using the standard protocol following bolus administration of intravenous contrast. RADIATION DOSE REDUCTION: This exam was performed according to the departmental dose-optimization program which includes automated exposure control, adjustment of the mA and/or kV according to patient size and/or use of iterative reconstruction technique. CONTRAST:  38m OMNIPAQUE IOHEXOL 350 MG/ML SOLN COMPARISON:  CT scan 03/17/2022 and MRI 04/28/2022 FINDINGS: Lower chest: See moderate-sized right pleural effusion with overlying atelectasis. Heart is normal in size. No pericardial effusion. Coronary artery calcifications are noted. Hepatobiliary: Enlarged liver with multiple hepatic masses. Partially necrotic large mass occupying most of the right hepatic lobe. The gallbladder contains numerous small gallstones. No common  bile duct dilatation. Pancreas: No mass, inflammation or ductal dilatation. Spleen: Normal size.  No focal lesions. Adrenals/Urinary Tract: The adrenal glands unremarkable. No worrisome renal lesions hydronephrosis. Stable right renal calculus. The bladder is. Stomach/Bowel: The stomach, duodenum, small bowel and colon grossly. No obstructive findings. Vascular/Lymphatic: Stable scattered atherosclerotic calcifications involving the aorta and iliac arteries. The major venous structures are patent. Stable scattered mesenteric and retroperitoneal lymph nodes. Reproductive: The prostate gland and seminal vesicles are unremarkable. Other: New moderate volume abdominal/pelvic ascites. Higher attenuation fluid in the right upper quadrant and tracking down the right  pericolic gutter and into the dependent portion of the pelvis. Possible bleeding from one of the liver lesions. Has there been a recent liver biopsy? Musculoskeletal: No significant bony findings. IMPRESSION: 1. New moderate volume abdominal/pelvic ascites with higher attenuation fluid in the right upper quadrant and tracking down the right paracolic gutter and into the dependent portion of the pelvis. Possible bleeding from one of the liver lesions. Has there been a recent liver biopsy? 2. Enlarged liver with multiple hepatic masses. 3. Cholelithiasis. 4. Stable right renal calculus. 5. Aortic atherosclerosis. Aortic Atherosclerosis (ICD10-I70.0). Electronically Signed   By: Marijo Sanes M.D.   On: 06/11/2022 12:10   DG Chest 2 View  Result Date: 06/10/2022 CLINICAL DATA:  Shortness of breath. Weakness. EXAM: CHEST - 2 VIEW COMPARISON:  Radiograph 06/23/2018 lung bases from abdominal MRI 04/28/2022 FINDINGS: Right pleural effusion with associated basilar airspace disease. Additional right perihilar atelectasis/scarring. The heart is normal in size with stable mediastinal contours. The left lung is clear. No pneumothorax. IMPRESSION: Right pleural  effusion which is likely in part chronic. Associated basilar airspace disease may represent atelectasis or pneumonia. Additional right perihilar linear atelectasis or scar. Electronically Signed   By: Ikenna Rake M.D.   On: 06/10/2022 15:57

## 2022-06-11 NOTE — H&P (Signed)
History and Physical    Patient: Mark Villarreal JKD:[id number] DOB: [birth year] DOA: 06/10/2022 DOS: the patient was seen and examined on 06/11/2022 PCP: Lucianne Lei, MD  Patient coming from: Home - lives with his wife    Chief Complaint: abdominal pain and weakness   HPI: Mark Villarreal is a 61 y.o. male with medical history significant of HTN, chronic hepatitis C s/p treatment, GERD who presented to ED with complaints of abdominal pain x2 days. He states he started to have pain in his abdomen at work yesterday that was intermittent and rated as a 5/10 and described as dull. Stays in RUQ. An employee lightly tapped his stomach and he started to have pain after this. When he got home from work he felt increasing weak to the point he thought he was going to pass out so called his wife and they came to ED. He states his appetite varies but he typically has early satiety and bloating. He denies any N/V/D. Does complain of mucous in his stool and chronic rhinitis.   He has been feeling good. Denies any fever/chills, vision changes/headaches, chest pain or palpitations, shortness of breath or cough, N/V/D, dysuria or leg swelling.   MR abdomen on 8/29 showed multiple enhancing masses within both hepatic lobes with dominant 12cm mass in right hepatic lobe. Most consistent with primary hepatic neoplasm and favoring multifocal hepatocellular CA. Moderate right layering pleural effusion. He didn't f/u with oncology when they called to schedule.   He does not smoke or drink .  ER Course:  vitals: afebrile, bp: 113/82, HR: 98, RR: 18, oxygen: 98%RA Pertinent labs: hgb: 10, platelets: 567, creatinine: 1.93, AST: 97, ALT: 123, t.bili: 1.5,  CXR: right pleural effusion which is likely in part chronic. Associated basilar airspace disease may represent atelectasis or pneumonia.  CT abdomen: 1. New moderate volume abdominal/pelvic ascites with higher attenuation fluid in the right upper quadrant and  tracking down the right paracolic gutter and into the dependent portion of the pelvis. Possible bleeding from one of the liver lesions. Has there been a recent liver biopsy? 2. Enlarged liver with multiple hepatic masses. 3. Cholelithiasis. 4. Stable right renal calculus. In ED: general surgery consulted. TRH asked to admit.     Review of Systems: As mentioned in the history of present illness. All other systems reviewed and are negative. Past Medical History:  Diagnosis Date   Acute kidney injury ([place])    Arthritis    knee and neck   Chest pain 06/23/2018   Cholelithiasis 05/2018   COVID 2022   mild case   GERD (gastroesophageal reflux disease)    Hepatic steatosis    Hypertension    Past Surgical History:  Procedure Laterality Date   COLONOSCOPY     RADIOLOGY WITH ANESTHESIA N/A 04/28/2022   Procedure: MRI abdomen with and without contrast WITH ANESTHESIA;  Surgeon: Radiologist, Medication, MD;  Location: [place];  Service: Radiology;  Laterality: N/A;   WRIST SURGERY Right    Social History:  reports that he has never smoked. He has never used smokeless tobacco. He reports current alcohol use. He reports that he does not use drugs.  No Known Allergies  No family history on file.  Prior to Admission medications   Medication Sig Start Date End Date Taking? Authorizing Provider  amLODipine (NORVASC) 10 MG tablet Take 10 mg by mouth at bedtime. 07/07/18   [provider]  ASPIRIN LOW DOSE 81 MG EC tablet Take 81 mg by  mouth at bedtime.  06/09/18   [provider]  BYSTOLIC 20 MG TABS Take 20 mg by mouth daily.  03/06/20   [provider]  Probiotic Product (PROBIOTIC DAILY) CAPS Take 1 capsule by mouth daily. Ultra Flora Probiotic - prescribed by Dr. Collene Mares     [provider]    Physical Exam: Vitals:   06/11/22 0325 06/11/22 0600 06/11/22 0957 06/11/22 1533  BP: 107/73 113/67 109/74   Pulse: 90 87 91   Resp: '17 17 16   '$ Temp: 97.9 F  (36.6 C) 97.9 F (36.6 C) 98.5 F (36.9 C) 98.4 F (36.9 C)  TempSrc:   Oral Oral  SpO2: 96% 98% 98%   Weight:      Height:       General:  Appears calm and comfortable and is in NAD Eyes:  PERRL, EOMI, normal lids, iris ENT:  grossly normal hearing, lips & tongue, mmm; appropriate dentition Neck:  no LAD, masses or thyromegaly; no carotid bruits Cardiovascular:  RRR, no m/r/g. No LE edema.  Respiratory:   CTA bilaterally with no wheezes/rales/rhonchi.  Normal respiratory effort. Abdomen:  soft, distended. TTP over RUQ/RLQ. No rebound or guarding. BS+ Back:   normal alignment, no CVAT Skin:  no rash or induration seen on limited exam Musculoskeletal:  grossly normal tone BUE/BLE, good ROM, no bony abnormality Lower extremity:  No LE edema.  Limited foot exam with no ulcerations.  2+ distal pulses. Psychiatric:  grossly normal mood and affect, speech fluent and appropriate, AOx3 Neurologic:  CN 2-12 grossly intact, moves all extremities in coordinated fashion, sensation intact   Radiological Exams on Admission: Independently reviewed - see discussion in A/P where applicable  CT ABDOMEN PELVIS W CONTRAST  Result Date: 06/11/2022 CLINICAL DATA:  Abdominal pain.  Known hepatic masses. EXAM: CT ABDOMEN AND PELVIS WITH CONTRAST TECHNIQUE: Multidetector CT imaging of the abdomen and pelvis was performed using the standard protocol following bolus administration of intravenous contrast. RADIATION DOSE REDUCTION: This exam was performed according to the departmental dose-optimization program which includes automated exposure control, adjustment of the mA and/or kV according to patient size and/or use of iterative reconstruction technique. CONTRAST:  4m OMNIPAQUE IOHEXOL 350 MG/ML SOLN COMPARISON:  CT scan 03/17/2022 and MRI 04/28/2022 FINDINGS: Lower chest: See moderate-sized right pleural effusion with overlying atelectasis. Heart is normal in size. No pericardial effusion. Coronary artery  calcifications are noted. Hepatobiliary: Enlarged liver with multiple hepatic masses. Partially necrotic large mass occupying most of the right hepatic lobe. The gallbladder contains numerous small gallstones. No common bile duct dilatation. Pancreas: No mass, inflammation or ductal dilatation. Spleen: Normal size.  No focal lesions. Adrenals/Urinary Tract: The adrenal glands unremarkable. No worrisome renal lesions hydronephrosis. Stable right renal calculus. The bladder is. Stomach/Bowel: The stomach, duodenum, small bowel and colon grossly. No obstructive findings. Vascular/Lymphatic: Stable scattered atherosclerotic calcifications involving the aorta and iliac arteries. The major venous structures are patent. Stable scattered mesenteric and retroperitoneal lymph nodes. Reproductive: The prostate gland and seminal vesicles are unremarkable. Other: New moderate volume abdominal/pelvic ascites. Higher attenuation fluid in the right upper quadrant and tracking down the right pericolic gutter and into the dependent portion of the pelvis. Possible bleeding from one of the liver lesions. Has there been a recent liver biopsy? Musculoskeletal: No significant bony findings. IMPRESSION: 1. New moderate volume abdominal/pelvic ascites with higher attenuation fluid in the right upper quadrant and tracking down the right paracolic gutter and into the dependent portion of the pelvis. Possible  bleeding from one of the liver lesions. Has there been a recent liver biopsy? 2. Enlarged liver with multiple hepatic masses. 3. Cholelithiasis. 4. Stable right renal calculus. 5. Aortic atherosclerosis. Aortic Atherosclerosis (ICD10-I70.0). Electronically Signed   By: Marijo Sanes M.D.   On: 06/11/2022 12:10   DG Chest 2 View  Result Date: 06/10/2022 CLINICAL DATA:  Shortness of breath. Weakness. EXAM: CHEST - 2 VIEW COMPARISON:  Radiograph 06/23/2018 lung bases from abdominal MRI 04/28/2022 FINDINGS: Right pleural effusion with  associated basilar airspace disease. Additional right perihilar atelectasis/scarring. The heart is normal in size with stable mediastinal contours. The left lung is clear. No pneumothorax. IMPRESSION: Right pleural effusion which is likely in part chronic. Associated basilar airspace disease may represent atelectasis or pneumonia. Additional right perihilar linear atelectasis or scar. Electronically Signed   By: Desman Rake M.D.   On: 06/10/2022 15:57    EKG: Independently reviewed.  NSR with rate 98; nonspecific ST changes with no evidence of acute ischemia   Labs on Admission: I have personally reviewed the available labs and imaging studies at the time of the admission.  Pertinent labs:   hgb: 10,  platelets: 567,  creatinine: 1.93,  AST: 97,  ALT: 123,  t.bili: 1.5,   Assessment and Plan: Principal Problem:   Liver mass Active Problems:   Hypertension   Chronic hepatitis C without hepatic coma (HCC)   Transaminitis   Thrombocytosis   AKI (acute kidney injury) ([place])    Assessment and Plan: * Liver mass 61 year old male here with complaints of abdominal pain and weakness x 2 days found to have New moderate volume abdominal/pelvic ascites with higher attenuation fluid in the right upper quadrant and tracking down the right paracolic gutter and into the dependent portion of the pelvis. Possible bleeding from one of the liver lesions. -obs to med-surg -known multiple liver masses on MRI of abdomen on 8/29 within both hepatic lobes with dominant 12cm mass in right lobe, most consistent with primary hepatic neoplasm -hgb was 11.7 on 9/23 at PCP office and now 10.0 -concern for possible bleeding from lesion. General surgery consulted. Recommended obs and serial CBC -if CBC continues to drop will consult IR for possible emobolization -will keep NPO for now -check AFP and INR -serial CBC -SCDs -oncology consult   Hypertension Blood pressure normotensive and has not taken meds  in a few days in setting of possible active bleed -hold home norvasc and bystolic  -trend   Chronic hepatitis C without hepatic coma ([place]) S/p tx and follows with GI, Dr. Collene Mares   Transaminitis Near to what they were 1 month ago in setting of possible hepatocellular CA -check INR -check AFP Continue to trend   Thrombocytosis Concern secondary to malignancy vs. IDA.  Checking iron studies Trend   AKI (acute kidney injury) ([place]) ? If hepato-renal vs. Hypotension Holding beta-blocker/norvasc  Creatinine on 8/29 was 1.30 Check UA and urine studies  Strict I/O  No improvement, consider albumin      Advance Care Planning:   Code Status: Full Code   Consults: general surgery and oncology/IR   DVT Prophylaxis: SCDs  Family Communication: wife at bedside   Severity of Illness: The appropriate patient status for this patient is OBSERVATION. Observation status is judged to be reasonable and necessary in order to provide the required intensity of service to ensure the patient's safety. The patient's presenting symptoms, physical exam findings, and initial radiographic and laboratory data in the context of their  medical condition is felt to place them at decreased risk for further clinical deterioration. Furthermore, it is anticipated that the patient will be medically stable for discharge from the hospital within 2 midnights of admission.   Author: Orma Flaming, MD 06/11/2022 4:05 PM  For on call review [email or web address].

## 2022-06-11 NOTE — Assessment & Plan Note (Signed)
Blood pressure normotensive and has not taken meds in a few days in setting of possible active bleed -hold home norvasc and bystolic  -trend

## 2022-06-11 NOTE — Assessment & Plan Note (Signed)
?   If hepato-renal vs. Hypotension Holding beta-blocker/norvasc  Creatinine on 8/29 was 1.30 Check UA and urine studies  Strict I/O  No improvement, consider albumin

## 2022-06-11 NOTE — ED Provider Notes (Signed)
[facility] EMERGENCY DEPARTMENT Provider Note   CSN: [id number] Arrival date & time: 06/10/22  1355     History  Chief Complaint  Patient presents with   Weakness    Mark Villarreal is a 61 y.o. male.  HPI 61 year old male with a history of hypertension, cholelithiasis, GERD, hepatic steatosis, chronic hep C, arthritis presents to the ER complaints of generalized abdominal pain which started yesterday.  Patient reports the pain feels sharp and stabbing, seems to be positional.  He has had several episodes of nonbloody emesis.  No known fevers.  Has felt chills and weak.  Chart review, he had an MRI of the abdomen on 29 August of this year which showed findings concerning for primary hepatic neoplasm, likely multifocal hepatocellular carcinoma.  He states he has not seen an oncologist for this.  Denies any dysuria, no diarrhea, has been having regular bowel movements.    Home Medications Prior to Admission medications   Medication Sig Start Date End Date Taking? Authorizing Provider  amLODipine (NORVASC) 10 MG tablet Take 10 mg by mouth at bedtime. 07/07/18   [provider]  ASPIRIN LOW DOSE 81 MG EC tablet Take 81 mg by mouth at bedtime.  06/09/18   [provider]  BYSTOLIC 20 MG TABS Take 20 mg by mouth daily.  03/06/20   [provider]  Probiotic Product (PROBIOTIC DAILY) CAPS Take 1 capsule by mouth daily. Ultra Flora Probiotic - prescribed by Dr. Collene Mares     [provider]      Allergies    Patient has no known allergies.    Review of Systems   Review of Systems Ten systems reviewed and are negative for acute change, except as noted in the HPI.   Physical Exam Updated Vital Signs BP 109/74 (BP Location: Right Arm)   Pulse 91   Temp 98.5 F (36.9 C) (Oral)   Resp 16   Ht '5\' 10"'$  (1.778 m)   Wt 83 kg   SpO2 98%   BMI 26.26 kg/m  Physical Exam Vitals and nursing note reviewed.  Constitutional:      General:  He is not in acute distress.    Appearance: He is well-developed.  HENT:     Head: Normocephalic and atraumatic.  Eyes:     Conjunctiva/sclera: Conjunctivae normal.  Cardiovascular:     Rate and Rhythm: Normal rate and regular rhythm.     Heart sounds: No murmur heard. Pulmonary:     Effort: Pulmonary effort is normal. No respiratory distress.     Breath sounds: Normal breath sounds.  Abdominal:     Palpations: Abdomen is soft.     Tenderness: There is no abdominal tenderness.     Comments: Abdomen distended but soft, with no focal tenderness, generalized tenderness throughout, no peritoneal signs, no guarding.  Genitourinary:    Comments: Rectal exam performed with RN at bedside.  No gross blood, no visible hemorrhoids Musculoskeletal:        General: No swelling.     Cervical back: Neck supple.  Skin:    General: Skin is warm and dry.     Capillary Refill: Capillary refill takes less than 2 seconds.  Neurological:     General: No focal deficit present.     Mental Status: He is alert and oriented to person, place, and time.  Psychiatric:        Mood and Affect: Mood normal.     ED Results / Procedures /  Treatments   Labs (all labs ordered are listed, but only abnormal results are displayed) Labs Reviewed  CBC WITH DIFFERENTIAL/PLATELET - Abnormal; Notable for the following components:      Result Value   RBC 3.72 (*)    Hemoglobin 10.0 (*)    HCT 30.7 (*)    RDW 18.7 (*)    Platelets 567 (*)    All other components within normal limits  COMPREHENSIVE METABOLIC PANEL - Abnormal; Notable for the following components:   Glucose, Bld 126 (*)    Creatinine, Ser 1.93 (*)    Total Protein 6.2 (*)    Albumin 2.4 (*)    AST 97 (*)    ALT 123 (*)    Total Bilirubin 1.5 (*)    GFR, Estimated 39 (*)    All other components within normal limits  URINALYSIS, ROUTINE W REFLEX MICROSCOPIC - Abnormal; Notable for the following components:   Color, Urine AMBER (*)    APPearance  HAZY (*)    Ketones, ur 5 (*)    Protein, ur 30 (*)    All other components within normal limits  LIPASE, BLOOD  CBC  POC OCCULT BLOOD, ED  TROPONIN I (HIGH SENSITIVITY)  TROPONIN I (HIGH SENSITIVITY)    EKG EKG Interpretation  Date/Time:  Wednesday June 10 2022 15:02:39 EDT Ventricular Rate:  98 PR Interval:  128 QRS Duration: 86 QT Interval:  348 QTC Calculation: 444 R Axis:   2 Text Interpretation: Normal sinus rhythm Possible Anterior infarct , age undetermined Abnormal ECG When compared with ECG of 28-Apr-2022 08:12, No significant change was found Confirmed by Delora Fuel ([id number]) on 06/10/2022 11:17:55 PM  Radiology CT ABDOMEN PELVIS W CONTRAST  Result Date: 06/11/2022 CLINICAL DATA:  Abdominal pain.  Known hepatic masses. EXAM: CT ABDOMEN AND PELVIS WITH CONTRAST TECHNIQUE: Multidetector CT imaging of the abdomen and pelvis was performed using the standard protocol following bolus administration of intravenous contrast. RADIATION DOSE REDUCTION: This exam was performed according to the departmental dose-optimization program which includes automated exposure control, adjustment of the mA and/or kV according to patient size and/or use of iterative reconstruction technique. CONTRAST:  73m OMNIPAQUE IOHEXOL 350 MG/ML SOLN COMPARISON:  CT scan 03/17/2022 and MRI 04/28/2022 FINDINGS: Lower chest: See moderate-sized right pleural effusion with overlying atelectasis. Heart is normal in size. No pericardial effusion. Coronary artery calcifications are noted. Hepatobiliary: Enlarged liver with multiple hepatic masses. Partially necrotic large mass occupying most of the right hepatic lobe. The gallbladder contains numerous small gallstones. No common bile duct dilatation. Pancreas: No mass, inflammation or ductal dilatation. Spleen: Normal size.  No focal lesions. Adrenals/Urinary Tract: The adrenal glands unremarkable. No worrisome renal lesions hydronephrosis. Stable right renal  calculus. The bladder is. Stomach/Bowel: The stomach, duodenum, small bowel and colon grossly. No obstructive findings. Vascular/Lymphatic: Stable scattered atherosclerotic calcifications involving the aorta and iliac arteries. The major venous structures are patent. Stable scattered mesenteric and retroperitoneal lymph nodes. Reproductive: The prostate gland and seminal vesicles are unremarkable. Other: New moderate volume abdominal/pelvic ascites. Higher attenuation fluid in the right upper quadrant and tracking down the right pericolic gutter and into the dependent portion of the pelvis. Possible bleeding from one of the liver lesions. Has there been a recent liver biopsy? Musculoskeletal: No significant bony findings. IMPRESSION: 1. New moderate volume abdominal/pelvic ascites with higher attenuation fluid in the right upper quadrant and tracking down the right paracolic gutter and into the dependent portion of the pelvis. Possible bleeding from one of  the liver lesions. Has there been a recent liver biopsy? 2. Enlarged liver with multiple hepatic masses. 3. Cholelithiasis. 4. Stable right renal calculus. 5. Aortic atherosclerosis. Aortic Atherosclerosis (ICD10-I70.0). Electronically Signed   By: Marijo Sanes M.D.   On: 06/11/2022 12:10   DG Chest 2 View  Result Date: 06/10/2022 CLINICAL DATA:  Shortness of breath. Weakness. EXAM: CHEST - 2 VIEW COMPARISON:  Radiograph 06/23/2018 lung bases from abdominal MRI 04/28/2022 FINDINGS: Right pleural effusion with associated basilar airspace disease. Additional right perihilar atelectasis/scarring. The heart is normal in size with stable mediastinal contours. The left lung is clear. No pneumothorax. IMPRESSION: Right pleural effusion which is likely in part chronic. Associated basilar airspace disease may represent atelectasis or pneumonia. Additional right perihilar linear atelectasis or scar. Electronically Signed   By: Christie Rake M.D.   On: 06/10/2022  15:57    Procedures Procedures    Medications Ordered in ED Medications  ondansetron (ZOFRAN-ODT) disintegrating tablet 4 mg (has no administration in time range)  fentaNYL (SUBLIMAZE) injection 25 mcg (25 mcg Intravenous Patient Refused/Not Given 06/11/22 1228)  iohexol (OMNIPAQUE) 350 MG/ML injection 50 mL (50 mLs Intravenous Contrast Given 06/11/22 1152)    ED Course/ Medical Decision Making/ A&P                           Medical Decision Making Amount and/or Complexity of Data Reviewed Labs: ordered. Radiology: ordered.  Risk Prescription drug management.  61 year old male presented to the ER with complaints of abdominal pain.  On arrival, he is jaundiced appearing, however otherwise stable.  Vitals reassuring.  Physical exam with diffuse abdominal tenderness, no peritoneal signs, no guarding, her abdomen is distended but soft.  DDx includes SBO, ileus, failure, cholecystitis, ascending cholangitis, constipation, appendicitis, pancreatitis, GERD  Labs ordered, reviewed and interpreted by me.  CBC with a hemoglobin of 10, per chart review, no recent labs but labs from 221 show hemoglobin of 14, today it is 10.  He also has a thrombocytosis ptosis at 567.  No leukocytosis.  CMP w/ a creatinine of 1.93, 1.3 about a month ago. AST and ALT slightly more elevated from prior. Tbili at 1.5, which is new. Lipase normal. Fecal occult negative. UA with proteinuria and ketones likely consistent with dehydration  CT the abdomen pelvis was ordered, reviewed, agree with radiology read.  IMPRESSION:  1. New moderate volume abdominal/pelvic ascites with higher  attenuation fluid in the right upper quadrant and tracking down the  right paracolic gutter and into the dependent portion of the pelvis.  Possible bleeding from one of the liver lesions. Has there been a  recent liver biopsy?  2. Enlarged liver with multiple hepatic masses.  3. Cholelithiasis.  4. Stable right renal calculus.  5.  Aortic atherosclerosis.   Given drop in hemoglobin and concerns for possible bleeding liver lesion, I consulted general surgery.  They will see and evaluate the patient, however recommend medical admission for trending hemoglobins.  After the patient fentanyl for pain however his wife reports that they are adverse to any kind of medications.  Consulted hospitalist for admission.  Spoke w/ the hospitalist team who will admit the patient for further evaluation and treatment  Final Clinical Impression(s) / ED Diagnoses Final diagnoses:  Generalized abdominal pain  Hyperbilirubinemia    Rx / DC Orders ED Discharge Orders     None         Garald Balding, PA-C 06/11/22 1340  Isla Pence, MD 06/11/22 1432

## 2022-06-11 NOTE — ED Notes (Signed)
Provider at bedside at this time

## 2022-06-11 NOTE — Assessment & Plan Note (Signed)
S/p tx and follows with GI, Dr. Collene Mares

## 2022-06-11 NOTE — Assessment & Plan Note (Addendum)
Near to what they were 1 month ago in setting of possible hepatocellular CA -check INR -check AFP Continue to trend

## 2022-06-11 NOTE — Assessment & Plan Note (Signed)
Concern secondary to malignancy vs. IDA.  Checking iron studies Trend

## 2022-06-11 NOTE — Assessment & Plan Note (Addendum)
61 year old male here with complaints of abdominal pain and weakness x 2 days found to have New moderate volume abdominal/pelvic ascites with higher attenuation fluid in the right upper quadrant and tracking down the right paracolic gutter and into the dependent portion of the pelvis. Possible bleeding from one of the liver lesions. -obs to med-surg -known multiple liver masses on MRI of abdomen on 8/29 within both hepatic lobes with dominant 12cm mass in right lobe, most consistent with primary hepatic neoplasm -hgb was 11.7 on 9/23 at PCP office and now 10.0 -concern for possible bleeding from lesion. General surgery consulted. Recommended obs and serial CBC -if CBC continues to drop will consult IR for possible emobolization -will keep NPO for now -check AFP and INR -serial CBC -SCDs -oncology consult

## 2022-06-11 NOTE — Consult Note (Signed)
[facility] Surgery Consult Note  Mark Villarreal [birth year]  [id number].    Requesting MD: Alison Stalling Chief Complaint/Reason for Consult: hemoperitoneum  HPI:  Mark Villarreal is a 61 y.o. male PMH HTN and GERD who presented to [facility] yesterday with worsening abdominal pain. Of note he underwent MRI 04/28/22 that showed findings concerning for primary hepatic neoplasm, likely hepatocellular carcinoma. He was referred to oncology but has not made an appointment yet because he was trying to process everything. States that he was doing ok until yesterday. Someone at work lightly tapped his abdomen yesterday then he developed gradual worsening generalized abdominal pain. Later in the day he felt weak and lightheaded. He became bloated and vomited. After this he decided to go to the ED for evaluation.  CT scan revealed new moderate volume abdominal/pelvic ascites with higher attenuation fluid in the right upper quadrant and tracking down the right paracolic gutter and into the dependent portion of the pelvis, possible bleeding from one of the liver lesions. Hemoglobin yesterday 10 from baseline of about 15. No repeat labs today. His vital signs are stable. General surgery asked to see.  Abdominal surgical history: none Anticoagulants: none Nonsmoker No h/o heavy alcohol use, but completely stopped drinking alcohol in August 2023 Employment: [employer]   No family history on file.  Past Medical History:  Diagnosis Date   Acute kidney injury ([place])    Arthritis    knee and neck   Chest pain 06/23/2018   Cholelithiasis 05/2018   COVID 2022   mild case   GERD (gastroesophageal reflux disease)    Hepatic steatosis    Hypertension     Past Surgical History:  Procedure Laterality Date   COLONOSCOPY     RADIOLOGY WITH ANESTHESIA N/A 04/28/2022   Procedure: MRI abdomen with and without contrast WITH ANESTHESIA;  Surgeon: Radiologist, Medication, MD;  Location: [facility];   Service: Radiology;  Laterality: N/A;   WRIST SURGERY Right     Social History:  reports that he has never smoked. He has never used smokeless tobacco. He reports current alcohol use. He reports that he does not use drugs.  Allergies: No Known Allergies  (Not in a hospital admission)   Prior to Admission medications   Medication Sig Start Date End Date Taking? Authorizing Provider  amLODipine (NORVASC) 10 MG tablet Take 10 mg by mouth at bedtime. 07/07/18   [provider]  ASPIRIN LOW DOSE 81 MG EC tablet Take 81 mg by mouth at bedtime.  06/09/18   [provider]  BYSTOLIC 20 MG TABS Take 20 mg by mouth daily.  03/06/20   [provider]  Probiotic Product (PROBIOTIC DAILY) CAPS Take 1 capsule by mouth daily. Ultra Flora Probiotic - prescribed by Dr. Collene Mares     [provider]    Blood pressure 109/74, pulse 91, temperature 98.5 F (36.9 C), temperature source Oral, resp. rate 16, height '5\' 10"'$  (1.778 m), weight 83 kg, SpO2 98 %. Physical Exam: General: pleasant, WD/WN male who is laying in bed in NAD HEENT: head is normocephalic, atraumatic.  Sclera are noninjected.  Pupils equal and round.  Ears and nose without any masses or lesions.  Mouth is pink and moist. Dentition fair Heart: regular, rate, and rhythm Lungs: CTAB, no wheezes, rhonchi, or rales noted.  Respiratory effort nonlabored Abd: soft, mild distension, no masses, hernias, or organomegaly. Generalized tenderness without rebound or guarding, no peritonitis MS: no BUE/BLE edema, calves soft and nontender  Skin: warm and dry with no masses, lesions, or rashes Psych: A&Ox4 with an appropriate affect Neuro: MAEs, no gross motor or sensory deficits BUE/BLE  Results for orders placed or performed during the hospital encounter of 06/10/22 (from the past 48 hour(s))  Urinalysis, Routine w reflex microscopic     Status: Abnormal   Collection Time: 06/10/22  3:13 PM  Result Value Ref Range    Color, Urine AMBER (A) YELLOW    Comment: BIOCHEMICALS MAY BE AFFECTED BY COLOR   APPearance HAZY (A) CLEAR   Specific Gravity, Urine 1.026 1.005 - 1.030   pH 5.0 5.0 - 8.0   Glucose, UA NEGATIVE NEGATIVE mg/dL   Hgb urine dipstick NEGATIVE NEGATIVE   Bilirubin Urine NEGATIVE NEGATIVE   Ketones, ur 5 (A) NEGATIVE mg/dL   Protein, ur 30 (A) NEGATIVE mg/dL   Nitrite NEGATIVE NEGATIVE   Leukocytes,Ua NEGATIVE NEGATIVE   RBC / HPF 0-5 0 - 5 RBC/hpf   WBC, UA 0-5 0 - 5 WBC/hpf   Bacteria, UA NONE SEEN NONE SEEN   Squamous Epithelial / LPF 0-5 0 - 5   Mucus PRESENT    Hyaline Casts, UA PRESENT     Comment: Performed at [facility], [place] [address], [place], [place] [address]  CBC with Differential     Status: Abnormal   Collection Time: 06/10/22  3:29 PM  Result Value Ref Range   WBC 8.1 4.0 - 10.5 K/uL   RBC 3.72 (L) 4.22 - 5.81 MIL/uL   Hemoglobin 10.0 (L) 13.0 - 17.0 g/dL   HCT 30.7 (L) 39.0 - 52.0 %   MCV 82.5 80.0 - 100.0 fL   MCH 26.9 26.0 - 34.0 pg   MCHC 32.6 30.0 - 36.0 g/dL   RDW 18.7 (H) 11.5 - 15.5 %   Platelets 567 (H) 150 - 400 K/uL   nRBC 0.0 0.0 - 0.2 %   Neutrophils Relative % 80 %   Neutro Abs 6.5 1.7 - 7.7 K/uL   Lymphocytes Relative 12 %   Lymphs Abs 0.9 0.7 - 4.0 K/uL   Monocytes Relative 7 %   Monocytes Absolute 0.5 0.1 - 1.0 K/uL   Eosinophils Relative 0 %   Eosinophils Absolute 0.0 0.0 - 0.5 K/uL   Basophils Relative 0 %   Basophils Absolute 0.0 0.0 - 0.1 K/uL   Immature Granulocytes 1 %   Abs Immature Granulocytes 0.07 0.00 - 0.07 K/uL    Comment: Performed at [facility], [place] [address] [place], [place] [address]  Comprehensive metabolic panel     Status: Abnormal   Collection Time: 06/10/22  3:29 PM  Result Value Ref Range   Sodium 135 135 - 145 mmol/L   Potassium 4.5 3.5 - 5.1 mmol/L   Chloride 98 98 - 111 mmol/L   CO2 24 22 - 32 mmol/L   Glucose, Bld 126 (H) 70 - 99 mg/dL    Comment: Glucose reference range applies only to  samples taken after fasting for at least 8 hours.   BUN 16 6 - 20 mg/dL   Creatinine, Ser 1.93 (H) 0.61 - 1.24 mg/dL   Calcium 9.2 8.9 - 10.3 mg/dL   Total Protein 6.2 (L) 6.5 - 8.1 g/dL   Albumin 2.4 (L) 3.5 - 5.0 g/dL   AST 97 (H) 15 - 41 U/L   ALT 123 (H) 0 - 44 U/L   Alkaline Phosphatase 66 38 - 126 U/L   Total Bilirubin 1.5 (H)  0.3 - 1.2 mg/dL   GFR, Estimated 39 (L) >60 mL/min    Comment: (NOTE) Calculated using the CKD-EPI Creatinine Equation (2021)    Anion gap 13 5 - 15    Comment: Performed at [place] [place] [address] [place], [place] [address]  Lipase, blood     Status: None   Collection Time: 06/10/22  3:29 PM  Result Value Ref Range   Lipase 35 11 - 51 U/L    Comment: Performed at [facility], [place] [address], [place], [place] [address]  Troponin I (High Sensitivity)     Status: None   Collection Time: 06/10/22  3:29 PM  Result Value Ref Range   Troponin I (High Sensitivity) 8 <18 ng/L    Comment: (NOTE) Elevated high sensitivity troponin I (hsTnI) values and significant  changes across serial measurements may suggest ACS but many other  chronic and acute conditions are known to elevate hsTnI results.  Refer to the "Links" section for chest pain algorithms and additional  guidance. Performed at [facility], [place] [address], [place], [place] [address]   Troponin I (High Sensitivity)     Status: None   Collection Time: 06/11/22  4:45 AM  Result Value Ref Range   Troponin I (High Sensitivity) 12 <18 ng/L    Comment: (NOTE) Elevated high sensitivity troponin I (hsTnI) values and significant  changes across serial measurements may suggest ACS but many other  chronic and acute conditions are known to elevate hsTnI results.  Refer to the "Links" section for chest pain algorithms and additional  guidance. Performed at [facility], [place] [address] [place], [place] [address]   POC occult blood, ED     Status: None   Collection Time:  06/11/22 10:12 AM  Result Value Ref Range   Fecal Occult Bld NEGATIVE NEGATIVE   CT ABDOMEN PELVIS W CONTRAST  Result Date: 06/11/2022 CLINICAL DATA:  Abdominal pain.  Known hepatic masses. EXAM: CT ABDOMEN AND PELVIS WITH CONTRAST TECHNIQUE: Multidetector CT imaging of the abdomen and pelvis was performed using the standard protocol following bolus administration of intravenous contrast. RADIATION DOSE REDUCTION: This exam was performed according to the departmental dose-optimization program which includes automated exposure control, adjustment of the mA and/or kV according to patient size and/or use of iterative reconstruction technique. CONTRAST:  33m OMNIPAQUE IOHEXOL 350 MG/ML SOLN COMPARISON:  CT scan 03/17/2022 and MRI 04/28/2022 FINDINGS: Lower chest: See moderate-sized right pleural effusion with overlying atelectasis. Heart is normal in size. No pericardial effusion. Coronary artery calcifications are noted. Hepatobiliary: Enlarged liver with multiple hepatic masses. Partially necrotic large mass occupying most of the right hepatic lobe. The gallbladder contains numerous small gallstones. No common bile duct dilatation. Pancreas: No mass, inflammation or ductal dilatation. Spleen: Normal size.  No focal lesions. Adrenals/Urinary Tract: The adrenal glands unremarkable. No worrisome renal lesions hydronephrosis. Stable right renal calculus. The bladder is. Stomach/Bowel: The stomach, duodenum, small bowel and colon grossly. No obstructive findings. Vascular/Lymphatic: Stable scattered atherosclerotic calcifications involving the aorta and iliac arteries. The major venous structures are patent. Stable scattered mesenteric and retroperitoneal lymph nodes. Reproductive: The prostate gland and seminal vesicles are unremarkable. Other: New moderate volume abdominal/pelvic ascites. Higher attenuation fluid in the right upper quadrant and tracking down the right pericolic gutter and into the dependent  portion of the pelvis. Possible bleeding from one of the liver lesions. Has there been a recent liver biopsy? Musculoskeletal: No significant bony findings.  IMPRESSION: 1. New moderate volume abdominal/pelvic ascites with higher attenuation fluid in the right upper quadrant and tracking down the right paracolic gutter and into the dependent portion of the pelvis. Possible bleeding from one of the liver lesions. Has there been a recent liver biopsy? 2. Enlarged liver with multiple hepatic masses. 3. Cholelithiasis. 4. Stable right renal calculus. 5. Aortic atherosclerosis. Aortic Atherosclerosis (ICD10-I70.0). Electronically Signed   By: Marijo Sanes M.D.   On: 06/11/2022 12:10   DG Chest 2 View  Result Date: 06/10/2022 CLINICAL DATA:  Shortness of breath. Weakness. EXAM: CHEST - 2 VIEW COMPARISON:  Radiograph 06/23/2018 lung bases from abdominal MRI 04/28/2022 FINDINGS: Right pleural effusion with associated basilar airspace disease. Additional right perihilar atelectasis/scarring. The heart is normal in size with stable mediastinal contours. The left lung is clear. No pneumothorax. IMPRESSION: Right pleural effusion which is likely in part chronic. Associated basilar airspace disease may represent atelectasis or pneumonia. Additional right perihilar linear atelectasis or scar. Electronically Signed   By: Akash Rake M.D.   On: 06/10/2022 15:57      Assessment/Plan Hepatic neoplasm with bleeding Acute blood loss anemia - Patient was diagnosed with primary hepatic neoplasm, likely hepatocellular carcinoma, on MRI 04/28/22. He has not yet made an appointment with oncology. We will have one of our hepatobiliary surgeons review. Patient comes in today with worsening abdominal pain, weakness, and lightheadedness. He was found to be anemic with hemoglobin 10 from baseline of 15. CT scan shows new moderate volume abdominal/pelvic ascites with higher attenuation fluid in the right upper quadrant and  tracking down the right paracolic gutter and into the dependent portion of the pelvis, possible bleeding from one of the liver lesions. He is hemodynamically stable. No indication for acute surgical intervention. Recommend medical admission, bed rest, serial CBCs. If he continues to bleed would recommend IR consult for possible embolization.   ID - none VTE - SCDs only FEN - IVF, NPO Foley - none  HTN GERD  I reviewed ED provider notes, last 24 h vitals and pain scores, last 48 h intake and output, last 24 h labs and trends, and last 24 h imaging results   Wellington Hampshire, PA-C [facility] 06/11/2022, 1:35 PM Please see Amion for pager number during day hours 7:00am-4:30pm

## 2022-06-12 ENCOUNTER — Observation Stay (HOSPITAL_COMMUNITY): Payer: 59

## 2022-06-12 DIAGNOSIS — K661 Hemoperitoneum: Secondary | ICD-10-CM | POA: Diagnosis present

## 2022-06-12 DIAGNOSIS — I871 Compression of vein: Secondary | ICD-10-CM | POA: Diagnosis present

## 2022-06-12 DIAGNOSIS — R55 Syncope and collapse: Secondary | ICD-10-CM | POA: Diagnosis present

## 2022-06-12 DIAGNOSIS — D62 Acute posthemorrhagic anemia: Secondary | ICD-10-CM | POA: Diagnosis present

## 2022-06-12 DIAGNOSIS — J910 Malignant pleural effusion: Secondary | ICD-10-CM | POA: Diagnosis present

## 2022-06-12 DIAGNOSIS — N200 Calculus of kidney: Secondary | ICD-10-CM | POA: Diagnosis present

## 2022-06-12 DIAGNOSIS — E860 Dehydration: Secondary | ICD-10-CM | POA: Diagnosis present

## 2022-06-12 DIAGNOSIS — R160 Hepatomegaly, not elsewhere classified: Secondary | ICD-10-CM | POA: Diagnosis not present

## 2022-06-12 DIAGNOSIS — K7460 Unspecified cirrhosis of liver: Secondary | ICD-10-CM | POA: Diagnosis present

## 2022-06-12 DIAGNOSIS — J948 Other specified pleural conditions: Secondary | ICD-10-CM | POA: Diagnosis present

## 2022-06-12 DIAGNOSIS — I10 Essential (primary) hypertension: Secondary | ICD-10-CM | POA: Diagnosis present

## 2022-06-12 DIAGNOSIS — Z8616 Personal history of COVID-19: Secondary | ICD-10-CM | POA: Diagnosis not present

## 2022-06-12 DIAGNOSIS — C220 Liver cell carcinoma: Secondary | ICD-10-CM | POA: Diagnosis present

## 2022-06-12 DIAGNOSIS — D649 Anemia, unspecified: Secondary | ICD-10-CM | POA: Diagnosis not present

## 2022-06-12 DIAGNOSIS — J310 Chronic rhinitis: Secondary | ICD-10-CM | POA: Diagnosis present

## 2022-06-12 DIAGNOSIS — N179 Acute kidney failure, unspecified: Secondary | ICD-10-CM | POA: Diagnosis present

## 2022-06-12 DIAGNOSIS — D75838 Other thrombocytosis: Secondary | ICD-10-CM | POA: Diagnosis present

## 2022-06-12 DIAGNOSIS — R180 Malignant ascites: Secondary | ICD-10-CM | POA: Diagnosis present

## 2022-06-12 DIAGNOSIS — R195 Other fecal abnormalities: Secondary | ICD-10-CM | POA: Diagnosis present

## 2022-06-12 DIAGNOSIS — Z7982 Long term (current) use of aspirin: Secondary | ICD-10-CM | POA: Diagnosis not present

## 2022-06-12 DIAGNOSIS — Z8619 Personal history of other infectious and parasitic diseases: Secondary | ICD-10-CM | POA: Diagnosis not present

## 2022-06-12 DIAGNOSIS — Z79899 Other long term (current) drug therapy: Secondary | ICD-10-CM | POA: Diagnosis not present

## 2022-06-12 DIAGNOSIS — K760 Fatty (change of) liver, not elsewhere classified: Secondary | ICD-10-CM | POA: Diagnosis present

## 2022-06-12 DIAGNOSIS — K219 Gastro-esophageal reflux disease without esophagitis: Secondary | ICD-10-CM | POA: Diagnosis present

## 2022-06-12 LAB — CBC
HCT: 23.8 % — ABNORMAL LOW (ref 39.0–52.0)
Hemoglobin: 8.2 g/dL — ABNORMAL LOW (ref 13.0–17.0)
MCH: 27.3 pg (ref 26.0–34.0)
MCHC: 34.5 g/dL (ref 30.0–36.0)
MCV: 79.3 fL — ABNORMAL LOW (ref 80.0–100.0)
Platelets: 378 10*3/uL (ref 150–400)
RBC: 3 MIL/uL — ABNORMAL LOW (ref 4.22–5.81)
RDW: 18.1 % — ABNORMAL HIGH (ref 11.5–15.5)
WBC: 5.4 10*3/uL (ref 4.0–10.5)
nRBC: 0 % (ref 0.0–0.2)

## 2022-06-12 LAB — COMPREHENSIVE METABOLIC PANEL
ALT: 108 U/L — ABNORMAL HIGH (ref 0–44)
ALT: 107 U/L — ABNORMAL HIGH (ref 0–44)
AST: 87 U/L — ABNORMAL HIGH (ref 15–41)
AST: 87 U/L — ABNORMAL HIGH (ref 15–41)
Albumin: 2 g/dL — ABNORMAL LOW (ref 3.5–5.0)
Albumin: 1.9 g/dL — ABNORMAL LOW (ref 3.5–5.0)
Alkaline Phosphatase: 56 U/L (ref 38–126)
Alkaline Phosphatase: 54 U/L (ref 38–126)
Anion gap: 8 (ref 5–15)
Anion gap: 9 (ref 5–15)
BUN: 21 mg/dL — ABNORMAL HIGH (ref 6–20)
BUN: 20 mg/dL (ref 6–20)
CO2: 26 mmol/L (ref 22–32)
CO2: 22 mmol/L (ref 22–32)
Calcium: 8.8 mg/dL — ABNORMAL LOW (ref 8.9–10.3)
Calcium: 8.5 mg/dL — ABNORMAL LOW (ref 8.9–10.3)
Chloride: 101 mmol/L (ref 98–111)
Chloride: 105 mmol/L (ref 98–111)
Creatinine, Ser: 2.24 mg/dL — ABNORMAL HIGH (ref 0.61–1.24)
Creatinine, Ser: 2.03 mg/dL — ABNORMAL HIGH (ref 0.61–1.24)
GFR, Estimated: 33 mL/min — ABNORMAL LOW (ref >60)
GFR, Estimated: 37 mL/min — ABNORMAL LOW (ref >60)
Glucose, Bld: 92 mg/dL (ref 70–99)
Glucose, Bld: 88 mg/dL (ref 70–99)
Potassium: 5.1 mmol/L (ref 3.5–5.1)
Potassium: 4.8 mmol/L (ref 3.5–5.1)
Sodium: 135 mmol/L (ref 135–145)
Sodium: 136 mmol/L (ref 135–145)
Total Bilirubin: 1.6 mg/dL — ABNORMAL HIGH (ref 0.3–1.2)
Total Bilirubin: 1.6 mg/dL — ABNORMAL HIGH (ref 0.3–1.2)
Total Protein: 5.3 g/dL — ABNORMAL LOW (ref 6.5–8.1)
Total Protein: 5.4 g/dL — ABNORMAL LOW (ref 6.5–8.1)

## 2022-06-12 LAB — TYPE AND SCREEN
ABO/RH(D): A POS
ABO/RH(D): A POS
Antibody Screen: NEGATIVE
Antibody Screen: NEGATIVE

## 2022-06-12 LAB — AFP TUMOR MARKER: AFP, Serum, Tumor Marker: 4291 ng/mL — ABNORMAL HIGH (ref 0.0–8.4)

## 2022-06-12 MED ORDER — SODIUM CHLORIDE 0.9 % IV SOLN: INTRAVENOUS | Status: DC

## 2022-06-12 MED ORDER — IOHEXOL 350 MG/ML SOLN
75.0000 mL | Freq: Once | INTRAVENOUS | Status: AC | PRN
  Administered 2022-06-12: 75 mL via INTRAVENOUS

## 2022-06-12 NOTE — Progress Notes (Signed)
[facility] Progress Note     Subjective: CC:  Denies abdominal pain, nausea, or vomiting. States he has not eating since Wednesday. Reports BMs that contain mucous. Also reports a lot of mucous in his nose/throat.  Objective: Vital signs in last 24 hours: Temp:  [98.2 F (36.8 C)-98.6 F (37 C)] 98.6 F (37 C) (10/13 0749) Pulse Rate:  [88-93] 90 (10/13 0749) Resp:  [16-18] 16 (10/13 0749) BP: (115-137)/(72-78) 119/77 (10/13 0749) SpO2:  [98 %-99 %] 99 % (10/13 0749) Last BM Date : 06/11/22  Intake/Output from previous day: 10/12 0701 - 10/13 0700 In: 583.7 [I.V.:583.7] Out: -  Intake/Output this shift: No intake/output data recorded.  PE: Gen:  Alert, NAD, pleasant Card:  Regular rate and rhythm Pulm:  Normal effort Abd: Soft, protuberant, nontender, no peritonitis  Skin: warm and dry, no rashes  Psych: A&Ox3   Lab Results:  Recent Labs    06/11/22 1355 06/12/22 0115  WBC 5.7 5.4  HGB 8.7* 8.2*  HCT 25.4* 23.8*  PLT 415* 378   BMET Recent Labs    06/10/22 1529 06/12/22 0115  NA 135 135  K 4.5 5.1  CL 98 101  CO2 24 26  GLUCOSE 126* 92  BUN 16 21*  CREATININE 1.93* 2.24*  CALCIUM 9.2 8.8*   PT/INR Recent Labs    06/11/22 1355  LABPROT 14.5  INR 1.1   CMP     Component Value Date/Time   NA 135 06/12/2022 0115   K 5.1 06/12/2022 0115   CL 101 06/12/2022 0115   CO2 26 06/12/2022 0115   GLUCOSE 92 06/12/2022 0115   BUN 21 (H) 06/12/2022 0115   CREATININE 2.24 (H) 06/12/2022 0115   CREATININE 1.23 06/14/2020 1023   CREATININE 1.17 02/08/2020 0948   CALCIUM 8.8 (L) 06/12/2022 0115   PROT 5.3 (L) 06/12/2022 0115   ALBUMIN 2.0 (L) 06/12/2022 0115   AST 87 (H) 06/12/2022 0115   AST 27 06/14/2020 1023   ALT 108 (H) 06/12/2022 0115   ALT 30 06/14/2020 1023   ALT 159 (H) 12/22/2019 1020   ALKPHOS 56 06/12/2022 0115   BILITOT 1.6 (H) 06/12/2022 0115   BILITOT 1.0 06/14/2020 1023   GFRNONAA 33 (L) 06/12/2022 0115   GFRNONAA >60  06/14/2020 1023   GFRAA >60 03/08/2020 1303   GFRAA >60 12/07/2019 1221   Lipase     Component Value Date/Time   LIPASE 35 06/10/2022 1529       Studies/Results: CT ABDOMEN PELVIS W CONTRAST  Result Date: 06/11/2022 CLINICAL DATA:  Abdominal pain.  Known hepatic masses. EXAM: CT ABDOMEN AND PELVIS WITH CONTRAST TECHNIQUE: Multidetector CT imaging of the abdomen and pelvis was performed using the standard protocol following bolus administration of intravenous contrast. RADIATION DOSE REDUCTION: This exam was performed according to the departmental dose-optimization program which includes automated exposure control, adjustment of the mA and/or kV according to patient size and/or use of iterative reconstruction technique. CONTRAST:  49m OMNIPAQUE IOHEXOL 350 MG/ML SOLN COMPARISON:  CT scan 03/17/2022 and MRI 04/28/2022 FINDINGS: Lower chest: See moderate-sized right pleural effusion with overlying atelectasis. Heart is normal in size. No pericardial effusion. Coronary artery calcifications are noted. Hepatobiliary: Enlarged liver with multiple hepatic masses. Partially necrotic large mass occupying most of the right hepatic lobe. The gallbladder contains numerous small gallstones. No common bile duct dilatation. Pancreas: No mass, inflammation or ductal dilatation. Spleen: Normal size.  No focal lesions. Adrenals/Urinary Tract: The adrenal glands unremarkable. No worrisome renal  lesions hydronephrosis. Stable right renal calculus. The bladder is. Stomach/Bowel: The stomach, duodenum, small bowel and colon grossly. No obstructive findings. Vascular/Lymphatic: Stable scattered atherosclerotic calcifications involving the aorta and iliac arteries. The major venous structures are patent. Stable scattered mesenteric and retroperitoneal lymph nodes. Reproductive: The prostate gland and seminal vesicles are unremarkable. Other: New moderate volume abdominal/pelvic ascites. Higher attenuation fluid in the  right upper quadrant and tracking down the right pericolic gutter and into the dependent portion of the pelvis. Possible bleeding from one of the liver lesions. Has there been a recent liver biopsy? Musculoskeletal: No significant bony findings. IMPRESSION: 1. New moderate volume abdominal/pelvic ascites with higher attenuation fluid in the right upper quadrant and tracking down the right paracolic gutter and into the dependent portion of the pelvis. Possible bleeding from one of the liver lesions. Has there been a recent liver biopsy? 2. Enlarged liver with multiple hepatic masses. 3. Cholelithiasis. 4. Stable right renal calculus. 5. Aortic atherosclerosis. Aortic Atherosclerosis (ICD10-I70.0). Electronically Signed   By: Marijo Sanes M.D.   On: 06/11/2022 12:10   DG Chest 2 View  Result Date: 06/10/2022 CLINICAL DATA:  Shortness of breath. Weakness. EXAM: CHEST - 2 VIEW COMPARISON:  Radiograph 06/23/2018 lung bases from abdominal MRI 04/28/2022 FINDINGS: Right pleural effusion with associated basilar airspace disease. Additional right perihilar atelectasis/scarring. The heart is normal in size with stable mediastinal contours. The left lung is clear. No pneumothorax. IMPRESSION: Right pleural effusion which is likely in part chronic. Associated basilar airspace disease may represent atelectasis or pneumonia. Additional right perihilar linear atelectasis or scar. Electronically Signed   By: Boden Rake M.D.   On: 06/10/2022 15:57    Anti-infectives: Anti-infectives (From admission, onward)    None        Assessment/Plan Hepatic neoplasm with bleeding Acute blood loss anemia on chronic anemia - hemoperitoneum likely from liver masses after mild abdominal trauma. Abdominal exam benign. Hgb stable (8.2 from 8.7). ok for a diet. No role for IR embolization or surgery at present. - Patient was diagnosed with primary hepatic neoplasm, likely hepatocellular carcinoma, on MRI 04/28/22. Oncology  saw yesterday. If AFP is >500 high suggestive of HCC. If <500 they will recommend IR liver Bx. CA 19-9 also pending.   ID - none VTE - SCDs only FEN - IVF, advance diet to heart healthy, or as recommended by primary team Foley - none   HTN GERD     LOS: 0 days   I reviewed Consultant oncology notes, last 24 h vitals and pain scores, last 48 h intake and output, last 24 h labs and trends, and last 24 h imaging results.  This care required moderate level of medical decision making.   Obie Dredge, PA-C [facility] Please see Amion for pager number during day hours 7:00am-4:30pm

## 2022-06-12 NOTE — Progress Notes (Addendum)
PROGRESS NOTE    Mark Villarreal  VHQ:[id number] DOB: 1960/10/19 DOA: 06/10/2022 PCP: Lucianne Lei, MD     Brief Narrative:   HTN, chronic hepatitis C s/p treatment, GERD who presented to ED with complaints of abdominal pain x2 days  MR abdomen on 8/29 showed multiple enhancing masses within both hepatic lobes with dominant 12cm mass in right hepatic lobe. Most consistent with primary hepatic neoplasm and favoring multifocal hepatocellular CA. Moderate right layering pleural effusion. He didn't f/u with oncology when they called to schedule.   He does not smoke or drink .  Subjective:  Vital signs stable Creatinine  elevated  Hemoglobin 8.2 He reports less ab pain , he wants to eat Concerned about mucus in stool for two month Wife at bedside    Assessment & Plan:  Principal Problem:   Liver mass Active Problems:   Hypertension   Chronic hepatitis C without hepatic coma (HCC)   Transaminitis   Thrombocytosis   AKI (acute kidney injury) ([place])    Assessment and Plan:  * Liver mass/transaminitis /history of chronic hep C treated followed by Dr. Collene Mares - known multiple liver masses on MRI of abdomen on 8/29 within both hepatic lobes with dominant 12cm mass in right lobe, most consistent with primary hepatic neoplasm, New moderate volume abdominal/pelvic ascites with higher attenuation fluid in the right upper quadrant and tracking down the right paracolic gutter and into the dependent portion of the pelvis. -Possible bleeding from one of the liver lesions. - oncology/General surgery /IR for possible emobolization? ---INR wnl - AFP  in process -monitor hgb   Acute blood loss anemia, no overt external bleed, concerning for bleeding from liver lesions Hgb was 11.7 on 9/23 at PCP office  Tsats 14%, will discuss with him about iv iron vs oral iron Monitor hgb, transfuse if hgb less than 7 or symptomatic anemia IR following for possible  emobolization  Thrombocytosis Concern secondary to malignancy vs. IDA.  normalized   AKI Hepatorenal? Does has right renal stone ,no hydronephrosis on CT scan, Urine concentrated, no sign of infection Continue hydration Check bladder scan, pvc 106cc, increase activity, avoid constipation, will discuss flomax with patient Renal dosing medication   Hypertension Blood pressure normotensive and has not taken meds in a few days in setting of possible active bleed -hold home norvasc and bystolic  -trend         I have Reviewed nursing notes, Vitals, pain scores, I/o's, Lab results and  imaging results since pt's last encounter, details please see discussion above  I ordered the following labs:  Unresulted Labs (From admission, onward)     Start     Ordered   06/13/22 0500  CBC with Differential/Platelet  Tomorrow morning,   R       Question:  Specimen collection method  Answer:  Unit=Unit collect   06/12/22 0828   06/13/22 0500  Magnesium  Tomorrow morning,   R       Question:  Specimen collection method  Answer:  Unit=Unit collect   06/12/22 0828   06/13/22 0500  Comprehensive metabolic panel  Tomorrow morning,   R       Question:  Specimen collection method  Answer:  Unit=Unit collect   06/12/22 1804   06/12/22 0500  Cancer antigen 19-9  Tomorrow morning,   R        06/11/22 1728             DVT prophylaxis: SCDs Start: 06/11/22 1439  Code Status:   Code Status: Full Code  Family Communication: wife at bedside  Disposition:   Dispo: The patient is from: home               Anticipated d/c is to: home               Anticipated d/c date is: TBD, need hgb to be stable, need cr to improve  Antimicrobials:    Anti-infectives (From admission, onward)    None          Objective: Vitals:   06/11/22 2043 06/12/22 0431 06/12/22 0749 06/12/22 1617  BP: 137/78 121/78 119/77 127/88  Pulse: 93 89 90 (!) 110  Resp: '18 16 16 16  '$ Temp: 98.4 F (36.9 C) 98.4  F (36.9 C) 98.6 F (37 C) 98.2 F (36.8 C)  TempSrc: Oral Oral Oral Oral  SpO2: 99% 98% 99% 100%  Weight:      Height:        Intake/Output Summary (Last 24 hours) at 06/12/2022 1811 Last data filed at 06/12/2022 0400 Gross per 24 hour  Intake 583.65 ml  Output --  Net 583.65 ml   Filed Weights   06/10/22 1512  Weight: 83 kg    Examination:  General exam: alert, awake, communicative,calm, NAD Respiratory system: Clear to auscultation. Respiratory effort normal. Cardiovascular system:  RRR.  Gastrointestinal system: Abdomen is nondistended, soft and nontender.  Normal bowel sounds heard. Central nervous system: Alert and oriented. No focal neurological deficits. Extremities:  no edema Skin: No rashes, lesions or ulcers Psychiatry: Judgement and insight appear normal. Mood & affect appropriate.     Data Reviewed: I have personally reviewed  labs and visualized  imaging studies since the last encounter and formulate the plan        Scheduled Meds:  fentaNYL (SUBLIMAZE) injection  25 mcg Intravenous Once   ondansetron  4 mg Oral Once   sodium chloride flush  3 mL Intravenous Q12H   Continuous Infusions:  sodium chloride     sodium chloride 75 mL/hr at 06/12/22 0730     LOS: 0 days     Florencia Reasons, MD PhD FACP [facility]  Available via Epic secure chat 7am-7pm for nonurgent issues Please page for urgent issues To page the attending provider between 7A-7P or the covering provider during after hours 7P-7A, please log into the web site [email or web address] and access using universal [place] password for that web site. If you do not have the password, please call the hospital operator.    06/12/2022, 6:11 PM

## 2022-06-12 NOTE — Progress Notes (Signed)
IR Procedure Request - arteriogram possible embolization   61 y.o. male inpatient. HTN, chronic hepatitis C s/p treatment, GERD who presented to ED with complaints of abdominal pain x2 days. He states he started to have pain in his abdomen at work yesterday that was intermittent and rated as a 5/10 and described as dull. Stays in [employer]. An employee lightly tapped his stomach and he started to have pain after this. When he got home from work he felt increasing weak to the point he thought he was going to pass out so called his wife and they came to ED.CT Abd pelvis. Reads New moderate volume abdominal/pelvic ascites with higher attenuation fluid in the right upper quadrant and tracking down the right paracolic gutter and into the dependent portion of the pelvis. Possible bleeding from one of the liver lesions.  Review of CT scan by IR Attending shows a small bleed on the scan, but no significant anemia or clinical signs of hemorrhagic shock. Recommend transfuse as needed - if clinically significany anemia then would get repeat CTA (GI Bleed protocol) and reconsult IR.

## 2022-06-13 LAB — MAGNESIUM: Magnesium: 2 mg/dL (ref 1.7–2.4)

## 2022-06-13 LAB — CANCER ANTIGEN 19-9: CA 19-9: 482 U/mL — ABNORMAL HIGH (ref 0–35)

## 2022-06-13 LAB — COMPREHENSIVE METABOLIC PANEL
ALT: 106 U/L — ABNORMAL HIGH (ref 0–44)
AST: 90 U/L — ABNORMAL HIGH (ref 15–41)
Albumin: 2 g/dL — ABNORMAL LOW (ref 3.5–5.0)
Alkaline Phosphatase: 56 U/L (ref 38–126)
Anion gap: 6 (ref 5–15)
BUN: 18 mg/dL (ref 6–20)
CO2: 23 mmol/L (ref 22–32)
Calcium: 8.5 mg/dL — ABNORMAL LOW (ref 8.9–10.3)
Chloride: 103 mmol/L (ref 98–111)
Creatinine, Ser: 1.89 mg/dL — ABNORMAL HIGH (ref 0.61–1.24)
GFR, Estimated: 40 mL/min — ABNORMAL LOW (ref >60)
Glucose, Bld: 95 mg/dL (ref 70–99)
Potassium: 4.1 mmol/L (ref 3.5–5.1)
Sodium: 132 mmol/L — ABNORMAL LOW (ref 135–145)
Total Bilirubin: 1.1 mg/dL (ref 0.3–1.2)
Total Protein: 5.6 g/dL — ABNORMAL LOW (ref 6.5–8.1)

## 2022-06-13 LAB — CBC WITH DIFFERENTIAL/PLATELET
Abs Immature Granulocytes: 0.03 10*3/uL (ref 0.00–0.07)
Basophils Absolute: 0 10*3/uL (ref 0.0–0.1)
Basophils Relative: 1 %
Eosinophils Absolute: 0.1 10*3/uL (ref 0.0–0.5)
Eosinophils Relative: 1 %
HCT: 25 % — ABNORMAL LOW (ref 39.0–52.0)
Hemoglobin: 8.2 g/dL — ABNORMAL LOW (ref 13.0–17.0)
Immature Granulocytes: 1 %
Lymphocytes Relative: 34 %
Lymphs Abs: 1.7 10*3/uL (ref 0.7–4.0)
MCH: 26.8 pg (ref 26.0–34.0)
MCHC: 32.8 g/dL (ref 30.0–36.0)
MCV: 81.7 fL (ref 80.0–100.0)
Monocytes Absolute: 0.6 10*3/uL (ref 0.1–1.0)
Monocytes Relative: 11 %
Neutro Abs: 2.6 10*3/uL (ref 1.7–7.7)
Neutrophils Relative %: 52 %
Platelets: 368 10*3/uL (ref 150–400)
RBC: 3.06 MIL/uL — ABNORMAL LOW (ref 4.22–5.81)
RDW: 17.9 % — ABNORMAL HIGH (ref 11.5–15.5)
WBC: 5.1 10*3/uL (ref 4.0–10.5)
nRBC: 0 % (ref 0.0–0.2)

## 2022-06-13 MED ORDER — SODIUM CHLORIDE 0.9 % IV SOLN: INTRAVENOUS | Status: DC

## 2022-06-13 NOTE — Progress Notes (Addendum)
PROGRESS NOTE    Mark Villarreal  QMG:[id number] DOB: Nov 04, 1960 DOA: 06/10/2022 PCP: Lucianne Lei, MD     Brief Narrative:   HTN, chronic hepatitis C s/p treatment, GERD who presented to ED with complaints of abdominal pain x2 days  MR abdomen on 8/29 showed multiple enhancing masses within both hepatic lobes with dominant 12cm mass in right hepatic lobe. Most consistent with primary hepatic neoplasm and favoring multifocal hepatocellular CA. Moderate right layering pleural effusion. He didn't f/u with oncology when they called to schedule.   He does not smoke or drink .  Subjective:  Vital signs stable Creatinine  start to trend down Hemoglobin 8.2 He reports less ab pain , but ab is getting tighter , no edema on exam, denies sob, he ambulated in the hallx2 today he wants to eat Concerned about mucus in stool for two months Wife at bedside    Assessment & Plan:  Principal Problem:   Liver mass Active Problems:   Hypertension   Chronic hepatitis C without hepatic coma (HCC)   Transaminitis   Thrombocytosis   AKI (acute kidney injury) ([place])    Assessment and Plan:  * Liver mass/transaminitis /ascites/right-sided hepatic hydrothorax -history of chronic hep C treated followed by Dr. Collene Mares - known multiple liver masses on MRI of abdomen on 8/29 within both hepatic lobes with dominant 12cm mass in right lobe, most consistent with primary hepatic neoplasm,  -New moderate volume ascites and right hydrothorax on Ct chest and ab/pel. -Possible bleeding from one of the liver lesions? - oncology/General surgery /IR for possible emobolization? ---INR wnl - AFP  in 4000's. -monitor hgb Patient is considering paracentesis   Acute blood loss anemia, no overt external bleed, concerning for bleeding from liver lesions Hgb was 11.7 on 9/23 at PCP office  Tsats 14%, will discuss with him about iv iron vs oral iron Monitor hgb, transfuse if hgb less than 7 or symptomatic  anemia IR following for possible emobolization, hgb appears has been stable above 8 for the last 3 days  Thrombocytosis Concern secondary to malignancy vs. IDA.  normalized   AKI Hepatorenal? Does has right renal stone ,no hydronephrosis on CT scan, Urine concentrated, no sign of infection Continue hydration Check bladder scan, pvc 106cc, not sure is reliable with ascites being present in the pelvic,  increase activity, avoid constipation, will discuss flomax with patient Renal dosing medication   Hypertension Blood pressure normotensive and has not taken meds in a few days in setting of possible active bleed -hold home norvasc and bystolic  -trend         I have Reviewed nursing notes, Vitals, pain scores, I/o's, Lab results and  imaging results since pt's last encounter, details please see discussion above  I ordered the following labs:  Unresulted Labs (From admission, onward)     Start     Ordered   06/14/22 3267  Basic metabolic panel  Tomorrow morning,   R       Question:  Specimen collection method  Answer:  Unit=Unit collect   06/13/22 1729             DVT prophylaxis: SCDs Start: 06/11/22 1439   Code Status:   Code Status: Full Code  Family Communication: wife at bedside daily Disposition:   Dispo: The patient is from: home               Anticipated d/c is to: home  Anticipated d/c date is: 1-2 days, need hgb to be stable, need cr to improve, patient is considering paracentesis  Antimicrobials:    Anti-infectives (From admission, onward)    None          Objective: Vitals:   06/12/22 1617 06/12/22 1936 06/13/22 0532 06/13/22 0844  BP: 127/88 133/84 120/84 127/83  Pulse: (!) 110 100 85 86  Resp: '16 18 17 16  '$ Temp: 98.2 F (36.8 C) 98.7 F (37.1 C) 98.1 F (36.7 C) 98.1 F (36.7 C)  TempSrc: Oral Oral Oral Oral  SpO2: 100% 100% 96% 99%  Weight:      Height:       No intake or output data in the 24 hours ending  06/13/22 1730  Filed Weights   06/10/22 1512  Weight: 83 kg    Examination:  General exam: alert, awake, communicative,calm, NAD Respiratory system: diminished right lower lobe. Respiratory effort normal. Cardiovascular system:  RRR.  Gastrointestinal system: Abdomen is distended, soft and nontender.  Normal bowel sounds heard. Central nervous system: Alert and oriented. No focal neurological deficits. Extremities:  no edema Skin: No rashes, lesions or ulcers Psychiatry: Judgement and insight appear normal. Mood & affect appropriate.     Data Reviewed: I have personally reviewed  labs and visualized  imaging studies since the last encounter and formulate the plan        Scheduled Meds:  fentaNYL (SUBLIMAZE) injection  25 mcg Intravenous Once   ondansetron  4 mg Oral Once   sodium chloride flush  3 mL Intravenous Q12H   Continuous Infusions:  sodium chloride     sodium chloride 75 mL/hr at 06/13/22 0934     LOS: 1 day     Florencia Reasons, MD PhD FACP [facility]  Available via Epic secure chat 7am-7pm for nonurgent issues Please page for urgent issues To page the attending provider between 7A-7P or the covering provider during after hours 7P-7A, please log into the web site [email or web address] and access using universal [place] password for that web site. If you do not have the password, please call the hospital operator.    06/13/2022, 5:30 PM

## 2022-06-13 NOTE — Plan of Care (Signed)

## 2022-06-14 LAB — BASIC METABOLIC PANEL
Anion gap: 7 (ref 5–15)
BUN: 12 mg/dL (ref 6–20)
CO2: 23 mmol/L (ref 22–32)
Calcium: 8.5 mg/dL — ABNORMAL LOW (ref 8.9–10.3)
Chloride: 105 mmol/L (ref 98–111)
Creatinine, Ser: 1.61 mg/dL — ABNORMAL HIGH (ref 0.61–1.24)
GFR, Estimated: 49 mL/min — ABNORMAL LOW (ref >60)
Glucose, Bld: 93 mg/dL (ref 70–99)
Potassium: 3.9 mmol/L (ref 3.5–5.1)
Sodium: 135 mmol/L (ref 135–145)

## 2022-06-14 MED ORDER — VITAMIN C 500 MG PO TABS
500.0000 mg | ORAL_TABLET | Freq: Every day | ORAL | Status: DC
  Administered 2022-06-15 – 2022-06-16 (×2): 500 mg via ORAL
  Filled 2022-06-14 (×2): qty 1

## 2022-06-14 MED ORDER — FERROUS SULFATE 325 (65 FE) MG PO TABS
325.0000 mg | ORAL_TABLET | Freq: Every day | ORAL | Status: DC
  Administered 2022-06-15 – 2022-06-16 (×2): 325 mg via ORAL
  Filled 2022-06-14 (×2): qty 1

## 2022-06-14 NOTE — Progress Notes (Signed)
Mobility Specialist Progress Note   06/14/22 1513  Mobility  Activity Ambulated independently in hallway  Level of Assistance Independent  Assistive Device None  Distance Ambulated (ft) 2200 ft (+)  Range of Motion/Exercises Active;All extremities  Activity Response Tolerated well   Ambulated independently with IV pole. Tolerated without complaint or incident. Was left in hallway ambulating with spouse with all needs met.  [place] Nur Rabold, [place], [place]  WTGRM:[phone] Office: [phone]

## 2022-06-14 NOTE — Progress Notes (Addendum)
PROGRESS NOTE    Mark Villarreal  QQV:[id number] DOB: 19-Jun-1961 DOA: 06/10/2022 PCP: Lucianne Lei, MD     Brief Narrative:   HTN, chronic hepatitis C s/p treatment, GERD who presented to ED with complaints of abdominal pain x2 days  MR abdomen on 8/29 showed multiple enhancing masses within both hepatic lobes with dominant 12cm mass in right hepatic lobe. Most consistent with primary hepatic neoplasm and favoring multifocal hepatocellular CA. Moderate right layering pleural effusion. He didn't f/u with oncology when they called to schedule.   He does not smoke or drink .  Subjective:  Vital signs stable Creatinine  continue to  trend down Hemoglobin 8.2 He reports less ab pain , reports ab distention is stable , he is not sure if he wants to have paracentesis but he would like it ordered.  no edema on exam, denies sob, he ambulated in the hall  Wife at bedside    Assessment & Plan:  Principal Problem:   Liver mass Active Problems:   Hypertension   Chronic hepatitis C without hepatic coma (HCC)   Transaminitis   Thrombocytosis   AKI (acute kidney injury) ([place])    Assessment and Plan:  * Liver mass, likely hepatocellular carcinoma /ascites/right-sided hepatic hydrothorax -history of chronic hep C treated followed by Dr. Collene Mares - known multiple liver masses on MRI of abdomen on 8/29 within both hepatic lobes with dominant 12cm mass in right lobe, most consistent with primary hepatic neoplasm,  -New moderate volume ascites and right hydrothorax on Ct chest and ab/pel. -Possible bleeding from one of the liver lesions? - oncology/General surgery /IR for possible emobolization?hgb stable, reports ab pain has improved, currently no plan for intervention ---INR wnl - AFP  in 4000's. - -Patient has not decide on paracentesis but would like it ordered   Marked extrinsic compression of the IVC which appears nearly occluded. No lower extremity edema on exam Case discussed  with vascular surgery Dr. Carlis Abbott who recommend treat underlying liver cancer , no indication for vascular intervention  Acute blood loss anemia, no overt external bleed, concerning for bleeding from liver lesions Hgb was 11.7 on 9/23 at PCP office  Tsats 14%, start oral iron Monitor hgb, transfuse if hgb less than 7 or symptomatic anemia IR following for possible emobolization, hgb appears has been stable above 8 for the last several days  Thrombocytosis Concern secondary to malignancy vs. IDA.  normalized   AKI Concentrated urine,no sign of infection Hepatorenal? Does has right renal stone ,no hydronephrosis on CT scan, Cr improving on hydration, d/c hydration, encourage oral intake Check bladder scan, pvc 106cc, not sure is reliable with ascites being present in the pelvic,  increase activity, avoid constipation, he denies urinary difficulty Renal dosing medication   Hypertension Blood pressure normotensive and has not taken meds in a few days in setting of possible active bleed -home norvasc and bystolic  held since admission, bp stable without bp meds currently  -trend         I have Reviewed nursing notes, Vitals, pain scores, I/o's, Lab results and  imaging results since pt's last encounter, details please see discussion above  I ordered the following labs:  Unresulted Labs (From admission, onward)     Start     Ordered   06/15/22 0500  CBC with Differential/Platelet  Tomorrow morning,   R       Question:  Specimen collection method  Answer:  Unit=Unit collect   06/14/22 1456   06/15/22  0500  Comprehensive metabolic panel  Tomorrow morning,   R       Question:  Specimen collection method  Answer:  Unit=Unit collect   06/14/22 1456             DVT prophylaxis: SCDs Start: 06/11/22 1439   Code Status:   Code Status: Full Code  Family Communication: wife at bedside daily Disposition:   Dispo: The patient is from: home               Anticipated d/c is to:  home               Anticipated d/c date is: 1-2 days, need hgb to be stable, need cr to improve, patient is considering paracentesis, needs oncology clearance   Antimicrobials:    Anti-infectives (From admission, onward)    None          Objective: Vitals:   06/13/22 0844 06/13/22 1837 06/14/22 0453 06/14/22 0851  BP: 127/83 131/89 133/84 124/87  Pulse: 86 95 86 85  Resp: '16 16 16 16  '$ Temp: 98.1 F (36.7 C) 98.9 F (37.2 C) 98.2 F (36.8 C) 97.7 F (36.5 C)  TempSrc: Oral Oral Oral Oral  SpO2: 99% 100% 99% 98%  Weight:      Height:       No intake or output data in the 24 hours ending 06/14/22 1458  Filed Weights   06/10/22 1512  Weight: 83 kg    Examination:  General exam: alert, awake, communicative,calm, NAD Respiratory system: diminished right lower lobe. Respiratory effort normal. Cardiovascular system:  RRR.  Gastrointestinal system: Abdomen is distended, soft and nontender.  Normal bowel sounds heard. Central nervous system: Alert and oriented. No focal neurological deficits. Extremities:  no edema Skin: No rashes, lesions or ulcers Psychiatry: Judgement and insight appear normal. Mood & affect appropriate.     Data Reviewed: I have personally reviewed  labs and visualized  imaging studies since the last encounter and formulate the plan        Scheduled Meds:  [START ON 06/15/2022] ascorbic acid  500 mg Oral Daily   fentaNYL (SUBLIMAZE) injection  25 mcg Intravenous Once   [START ON 06/15/2022] ferrous sulfate  325 mg Oral Q breakfast   ondansetron  4 mg Oral Once   sodium chloride flush  3 mL Intravenous Q12H   Continuous Infusions:  sodium chloride       LOS: 2 days     Florencia Reasons, MD PhD FACP [facility]  Available via Epic secure chat 7am-7pm for nonurgent issues Please page for urgent issues To page the attending provider between 7A-7P or the covering provider during after hours 7P-7A, please log into the web site  [email or web address] and access using universal [place] password for that web site. If you do not have the password, please call the hospital operator.    06/14/2022, 2:58 PM

## 2022-06-15 ENCOUNTER — Inpatient Hospital Stay (HOSPITAL_COMMUNITY): Payer: 59

## 2022-06-15 HISTORY — PX: IR PARACENTESIS: IMG2679

## 2022-06-15 LAB — CBC WITH DIFFERENTIAL/PLATELET
Abs Immature Granulocytes: 0.03 10*3/uL (ref 0.00–0.07)
Basophils Absolute: 0 10*3/uL (ref 0.0–0.1)
Basophils Relative: 1 %
Eosinophils Absolute: 0.1 10*3/uL (ref 0.0–0.5)
Eosinophils Relative: 1 %
HCT: 25.2 % — ABNORMAL LOW (ref 39.0–52.0)
Hemoglobin: 8.3 g/dL — ABNORMAL LOW (ref 13.0–17.0)
Immature Granulocytes: 1 %
Lymphocytes Relative: 31 %
Lymphs Abs: 1.7 10*3/uL (ref 0.7–4.0)
MCH: 27.1 pg (ref 26.0–34.0)
MCHC: 32.9 g/dL (ref 30.0–36.0)
MCV: 82.4 fL (ref 80.0–100.0)
Monocytes Absolute: 0.7 10*3/uL (ref 0.1–1.0)
Monocytes Relative: 13 %
Neutro Abs: 2.9 10*3/uL (ref 1.7–7.7)
Neutrophils Relative %: 53 %
Platelets: 391 10*3/uL (ref 150–400)
RBC: 3.06 MIL/uL — ABNORMAL LOW (ref 4.22–5.81)
RDW: 18.2 % — ABNORMAL HIGH (ref 11.5–15.5)
WBC: 5.3 10*3/uL (ref 4.0–10.5)
nRBC: 0 % (ref 0.0–0.2)

## 2022-06-15 LAB — COMPREHENSIVE METABOLIC PANEL
ALT: 96 U/L — ABNORMAL HIGH (ref 0–44)
AST: 83 U/L — ABNORMAL HIGH (ref 15–41)
Albumin: 1.9 g/dL — ABNORMAL LOW (ref 3.5–5.0)
Alkaline Phosphatase: 58 U/L (ref 38–126)
Anion gap: 9 (ref 5–15)
BUN: 11 mg/dL (ref 6–20)
CO2: 23 mmol/L (ref 22–32)
Calcium: 8.6 mg/dL — ABNORMAL LOW (ref 8.9–10.3)
Chloride: 105 mmol/L (ref 98–111)
Creatinine, Ser: 1.52 mg/dL — ABNORMAL HIGH (ref 0.61–1.24)
GFR, Estimated: 52 mL/min — ABNORMAL LOW (ref >60)
Glucose, Bld: 88 mg/dL (ref 70–99)
Potassium: 4.2 mmol/L (ref 3.5–5.1)
Sodium: 137 mmol/L (ref 135–145)
Total Bilirubin: 1.3 mg/dL — ABNORMAL HIGH (ref 0.3–1.2)
Total Protein: 5.5 g/dL — ABNORMAL LOW (ref 6.5–8.1)

## 2022-06-15 MED ORDER — ALBUMIN HUMAN 25 % IV SOLN
12.5000 g | Freq: Once | INTRAVENOUS | Status: AC
  Administered 2022-06-15: 12.5 g via INTRAVENOUS
  Filled 2022-06-15: qty 50

## 2022-06-15 MED ORDER — LIDOCAINE HCL 1 % IJ SOLN
INTRAMUSCULAR | Status: AC
  Administered 2022-06-15: 10 mL
  Filled 2022-06-15: qty 20

## 2022-06-15 NOTE — Progress Notes (Signed)
PROGRESS NOTE  Mark Villarreal  DOB: 10-13-1960  PCP: Lucianne Lei, MD HMC:[id number]  DOA: 06/10/2022  LOS: 3 days  Hospital Day: 6  Brief narrative: Mark Villarreal is a 61 y.o. male with PMH significant for chronic hepatitis C s/p treatment, liver cirrhosis, HTN, GERD, arthritis  In the past few months, patient had ongoing work-up as an outpatient for possibility of malignancy. 04/28/2022, MRI abdomen showed multiple enhancing masses within both hepatic lobes with dominant 12cm mass in right hepatic lobe. Most consistent with primary hepatic neoplasm and favoring multifocal hepatocellular CA. Moderate right layering pleural effusion.  He didn't f/u with oncology when they called to schedule.    10/11, patient presented to the ED with complaint of abdominal pain and weakness for 2 days. CT abdomen in the ED showed a new moderate volume abdominal/pelvic ascites, possible bleeding from one of the liver lesions. Admitted to [facility] General surgery and oncology consultations were obtained. 10/13, CT chest showed no evidence of metastatic in the chest, moderate-sized right pleural effusion.  Marked extrinsic compression of the IVC which appears nearly occluded   Subjective: Patient was seen and examined this afternoon.  Pleasant middle-aged African-American male.  Walking inside the room.  Not in distress.  Abdominal distention due to ascites noted.  He was pending paracentesis this afternoon. Chart reviewed In the last 24 hours, patient is afebrile, hemodynamically stable, breathing room air Last set of labs from this morning showed hemoglobin low but stable at 8.3, WBC count normal, creatinine improving to 1.52, stable slightly elevated liver enzymes and bilirubin.  Assessment and plan: Possible hepatocellular carcinoma. Presented with progressively worsening abdominal pain, weakness in the setting of recently known multiple liver masses MRI of abdomen 8/29 -findings as above  showing large hepatic masses. CT abdomen this admission showed new moderate volume abdominal/pelvic ascites. CT chest with no metastasis Patient was seen by oncology Dr. Irene Limbo in the past for thrombocytopenia. Seen by oncologist Dr. Alvy Bimler this admission.  AFP level CA 19-9 level elevated to more than 4000, CA 19-9 level elevated to 482.   Per oncology note, if AFP greater than 500, is almost diagnostic of hepatocellular carcinoma. Dr. Irene Limbo to follow-up today.  New moderate volume ascites/right hydrothorax  Paracentesis this afternoon.  Albumin infusion ordered as well.  Possible bleeding from one of the liver lesions Seen by oncology/General surgery /IR for possible emobolization hgb stable, reports ab pain has improved, currently no plan for intervention INR normal.  Marked extrinsic compression of the IVC which appears nearly occluded. No lower extremity edema on exam Case discussed with vascular surgery Dr. Carlis Abbott who recommend treat underlying liver cancer , no indication for vascular intervention   history of liver cirrhosis, chronic hep C treated, followed by Dr. Collene Mares  Acute blood loss anemia Probably gradually dropping hemoglobin in last few weeks with underlying cancer.  Slightly more drop due to bleeding from the liver lesion.   Currently hemoglobin stable between 8 and 9. Keep aspirin on hold. Recent Labs    06/10/22 1529 06/11/22 1355 06/12/22 0115 06/13/22 0226 06/15/22 0205  HGB 10.0* 8.7* 8.2* 8.2* 8.3*  MCV 82.5 80.9 79.3* 81.7 82.4  FERRITIN  --  465*  --   --   --   TIBC  --  295  --   --   --   IRON  --  42*  --   --   --     AKI Concentrated urine,no sign of infection Prerenal versus hepatorenal?  CT scan does not show any evidence of stone, hydronephrosis or GU mass Cr gradually improving hydration.   Recent Labs    04/28/22 0851 06/10/22 1529 06/12/22 0115 06/12/22 0946 06/13/22 0226 06/14/22 0321 06/15/22 0205  BUN 9 16 21* '20 18 12 11   '$ CREATININE 1.30* 1.93* 2.24* 2.03* 1.89* 1.61* 1.52*   Hypertension Currently normotensive without meds.  Norvasc and Bystolic on hold.   Continue to monitor.    Goals of care   Code Status: Full Code    Mobility: Encourage ambulation  Skin assessment:     Nutritional status:  Body mass index is 26.26 kg/m.          Diet:  Diet Order             Diet Heart Room service appropriate? Yes; Fluid consistency: Thin  Diet effective now                   DVT prophylaxis:  SCDs Start: 06/11/22 1439   Antimicrobials: None Fluid: None Consultants: Oncology Family Communication: Family at bedside  Status is: Inpatient  Continue in-hospital care because: Pending paracentesis.  Monitor hemodynamics after paracentesis. Level of care: Med-Surg   Dispo: The patient is from: Home              Anticipated d/c is to: Home              Patient currently is not medically stable to d/c.   Difficult to place patient No     Infusions:   sodium chloride     albumin human      Scheduled Meds:  ascorbic acid  500 mg Oral Daily   fentaNYL (SUBLIMAZE) injection  25 mcg Intravenous Once   ferrous sulfate  325 mg Oral Q breakfast   ondansetron  4 mg Oral Once   sodium chloride flush  3 mL Intravenous Q12H    PRN meds: sodium chloride, ondansetron **OR** ondansetron (ZOFRAN) IV, sodium chloride flush, traMADol   Antimicrobials: Anti-infectives (From admission, onward)    None       Objective: Vitals:   06/15/22 0429 06/15/22 0818  BP: 127/80 131/88  Pulse: 84 88  Resp: 18 18  Temp: 98.3 F (36.8 C) 98 F (36.7 C)  SpO2: 99% 100%    Intake/Output Summary (Last 24 hours) at 06/15/2022 1448 Last data filed at 06/14/2022 2325 Gross per 24 hour  Intake 240 ml  Output 1 ml  Net 239 ml   Filed Weights   06/10/22 1512  Weight: 83 kg   Weight change:  Body mass index is 26.26 kg/m.   Physical Exam: General exam: Pleasant, middle-aged  African-American male.  Not in distress Skin: No rashes, lesions or ulcers. HEENT: Atraumatic, normocephalic, no obvious bleeding Lungs: Clear to auscultation bilaterally CVS: Regular rate and rhythm, no murmur GI/Abd Soft, nontender, distended from ascites, bowel sound present CNS: Alert, awake, oriented x3 Psychiatry: Mood appropriate Extremities: trace bilateral pedal edema, no calf tenderness  Data Review: I have personally reviewed the laboratory data and studies available.  F/u labs  Unresulted Labs (From admission, onward)    None       Signed, Terrilee Croak, MD [facility] 06/15/2022

## 2022-06-15 NOTE — Progress Notes (Signed)
Pt transported to IR via bed by transportation staff.

## 2022-06-15 NOTE — Progress Notes (Signed)
Patient returned back to [place] room 11 alert and oriented, pain level 0/10. Bed in lowest position, call light in reach, will continue to monitor pt.

## 2022-06-15 NOTE — Procedures (Signed)
PROCEDURE SUMMARY:  Successful image-guided paracentesis from the right lower abdomen.  Yielded 2 liters of grossly bloody ascitic fluid.  No immediate complications.  EBL = trace. Patient tolerated well.   Specimen was not sent for labs.  Please see imaging section of Epic for full dictation.   Lura Em PA-C 06/15/2022 3:07 PM

## 2022-06-16 ENCOUNTER — Other Ambulatory Visit: Payer: Self-pay | Admitting: Hematology

## 2022-06-16 DIAGNOSIS — D649 Anemia, unspecified: Secondary | ICD-10-CM

## 2022-06-16 DIAGNOSIS — C220 Liver cell carcinoma: Secondary | ICD-10-CM

## 2022-06-16 MED ORDER — PROCHLORPERAZINE MALEATE 10 MG PO TABS: 10.0000 mg | ORAL_TABLET | Freq: Four times a day (QID) | ORAL | 1 refills | Status: AC | PRN

## 2022-06-16 MED ORDER — ONDANSETRON HCL 8 MG PO TABS: 8.0000 mg | ORAL_TABLET | Freq: Three times a day (TID) | ORAL | 1 refills | Status: AC | PRN

## 2022-06-16 MED ORDER — FERROUS SULFATE 325 (65 FE) MG PO TABS: 325.0000 mg | ORAL_TABLET | Freq: Every day | ORAL | 2 refills | Status: AC

## 2022-06-16 MED ORDER — TRAMADOL HCL 50 MG PO TABS: 50.0000 mg | ORAL_TABLET | Freq: Three times a day (TID) | ORAL | 0 refills | Status: AC | PRN

## 2022-06-16 NOTE — Discharge Summary (Signed)
Physician Discharge Summary  Mark Villarreal CBJ:[id number] DOB: 07/05/1961 DOA: 06/10/2022  PCP: Lucianne Lei, MD  Admit date: 06/10/2022 Discharge date: 06/17/2022  Admitted From: Home Discharge disposition: Home  Recommendations at discharge:  Stop aspirin, stop amlodipine.  Continue Bystolic. Follow-up with oncology as an outpatient   Brief narrative: Mark Villarreal is a 61 y.o. male with PMH significant for chronic hepatitis C s/p treatment, liver cirrhosis, HTN, GERD, arthritis  In the past few months, patient had ongoing work-up as an outpatient for possibility of malignancy. 04/28/2022, MRI abdomen showed multiple enhancing masses within both hepatic lobes with dominant 12cm mass in right hepatic lobe. Most consistent with primary hepatic neoplasm and favoring multifocal hepatocellular CA. Moderate right layering pleural effusion.  He didn't f/u with oncology when they called to schedule.    10/11, patient presented to the ED with complaint of abdominal pain and weakness for 2 days. CT abdomen in the ED showed a new moderate volume abdominal/pelvic ascites, possible bleeding from one of the liver lesions. Admitted to [facility]. General surgery and oncology consultations were obtained. 10/13, CT chest showed no evidence of metastatic in the chest, moderate-sized right pleural effusion.  Marked extrinsic compression of the IVC which appears nearly occluded  Subjective: Patient was seen and examined this morning.  Pleasant middle-aged African-American male.  Lying in bed.  Not in distress. Hemodynamically stable, breathing on room air. Labs from this morning show hemoglobin stable between 8 and 9, creatinine improving.  Assessment and plan: Possible hepatocellular carcinoma. Presented with progressively worsening abdominal pain, weakness in the setting of recently known multiple liver masses MRI of abdomen 8/29 -findings as above showing large hepatic masses. CT abdomen  this admission showed new moderate volume abdominal/pelvic ascites. CT chest with no metastasis Patient was seen by oncology Dr. Irene Limbo in the past for thrombocytopenia. Seen by oncologist Dr. Alvy Bimler this admission.  AFP level CA 19-9 level elevated to more than 4000, CA 19-9 level elevated to 482.   Per oncology note, if AFP greater than 500, is almost diagnostic of hepatocellular carcinoma. Seen by Dr. Irene Limbo on the day of discharge.  New moderate volume ascites/right hydrothorax  Bleeding from liver lesion 10/16, underwent ultrasound-guided paracentesis and yielded 2 L grossly bloody ascitic fluid.   Albumin infusion was given. Repeat hemoglobin today is stable between 8 and 9. INR normal. Recent Labs  Lab 06/11/22 1355  INR 1.1   Acute blood loss anemia Probably gradually dropping hemoglobin in last few weeks with underlying cancer.  Slightly more drop due to bleeding from the liver lesion.   Currently hemoglobin stable between 8 and 9. Aspirin remains on hold.  I would not resume it at discharge. Recent Labs    06/10/22 1529 06/11/22 1355 06/12/22 0115 06/13/22 0226 06/15/22 0205  HGB 10.0* 8.7* 8.2* 8.2* 8.3*  MCV 82.5 80.9 79.3* 81.7 82.4  FERRITIN  --  465*  --   --   --   TIBC  --  295  --   --   --   IRON  --  42*  --   --   --    Marked extrinsic compression of the IVC which appears nearly occluded. No lower extremity edema on exam Previous hospitalist discussed the case with vascular surgery Dr. Carlis Abbott who recommend treat underlying liver cancer , no indication for vascular intervention   history of liver cirrhosis, chronic hep C treated, followed by Dr. Collene Mares  AKI Concentrated urine,no sign of infection Prerenal  versus hepatorenal? CT scan does not show any evidence of stone, hydronephrosis or GU mass Cr gradually improving hydration.   Recent Labs    04/28/22 0851 06/10/22 1529 06/12/22 0115 06/12/22 0946 06/13/22 0226 06/14/22 0321 06/15/22 0205  BUN  9 16 21* '20 18 12 11  '$ CREATININE 1.30* 1.93* 2.24* 2.03* 1.89* 1.61* 1.52*   Hypertension Currently normotensive without meds.  Norvasc and Bystolic on hold.   At discharge, would resume Bystolic because of coexisting portal hypertension.  Can stop Norvasc. Continue to monitor blood pressure at home.  Wounds:  -    Discharge Exam:   Vitals:   06/16/22 0538 06/16/22 0840 06/16/22 1515 06/16/22 1700  BP: 129/84 125/88 (!) 129/93 (!) 139/94  Pulse: 88 83 (!) 116 98  Resp:  '18 18 18  '$ Temp: 98.2 F (36.8 C) 98 F (36.7 C) 98.2 F (36.8 C)   TempSrc: Oral Oral Oral   SpO2: 97% 99% 100% 100%  Weight:      Height:        Body mass index is 26.26 kg/m.  General exam: Pleasant, middle-aged African-American male.  Not in distress Skin: No rashes, lesions or ulcers. HEENT: Atraumatic, normocephalic, no obvious bleeding Lungs: Clear to auscultation bilaterally CVS: Regular rate and rhythm, no murmur GI/Abd Soft, nontender, improving distention, bowel sound present CNS: Alert, awake, oriented x3 Psychiatry: Mood appropriate Extremities: trace bilateral pedal edema, no calf tenderness  Follow ups:    Follow-up Information     Lucianne Lei, MD Follow up.   Specialty: Family Medicine Contact information: [address] [place] [address] [phone]                 Discharge Instructions:   Discharge Instructions     Call MD for:  difficulty breathing, headache or visual disturbances   Complete by: As directed    Call MD for:  extreme fatigue   Complete by: As directed    Call MD for:  hives   Complete by: As directed    Call MD for:  persistant dizziness or light-headedness   Complete by: As directed    Call MD for:  persistant nausea and vomiting   Complete by: As directed    Call MD for:  severe uncontrolled pain   Complete by: As directed    Call MD for:  temperature >100.4   Complete by: As directed    Diet - low sodium heart healthy    Complete by: As directed    Discharge instructions   Complete by: As directed    Recommendations at discharge:   Stop aspirin, stop amlodipine.  Continue Bystolic.  Follow-up with oncology as an outpatient    General discharge instructions: Follow with Primary MD Lucianne Lei, MD in 7 days  Please request your PCP  to go over your hospital tests, procedures, radiology results at the follow up. Please get your medicines reviewed and adjusted.  Your PCP may decide to repeat certain labs or tests as needed. Do not drive, operate heavy machinery, perform activities at heights, swimming or participation in water activities or provide baby sitting services if your were admitted for syncope or siezures until you have seen by Primary MD or a Neurologist and advised to do so again. [place] Controlled Substance Reporting System database was reviewed. Do not drive, operate heavy machinery, perform activities at heights, swim, participate in water activities or provide baby-sitting services while on medications for pain, sleep and mood  until your outpatient physician has reevaluated you and advised to do so again.  You are strongly recommended to comply with the dose, frequency and duration of prescribed medications. Activity: As tolerated with Full fall precautions use walker/cane & assistance as needed Avoid using any recreational substances like cigarette, tobacco, alcohol, or non-prescribed drug. If you experience worsening of your admission symptoms, develop shortness of breath, life threatening emergency, suicidal or homicidal thoughts you must seek medical attention immediately by calling 911 or calling your MD immediately  if symptoms less severe. You must read complete instructions/literature along with all the possible adverse reactions/side effects for all the medicines you take and that have been prescribed to you. Take any new medicine only after you have completely understood and accepted  all the possible adverse reactions/side effects.  Wear Seat belts while driving. You were cared for by a hospitalist during your hospital stay. If you have any questions about your discharge medications or the care you received while you were in the hospital after you are discharged, you can call the unit and ask to speak with the hospitalist or the covering physician. Once you are discharged, your primary care physician will handle any further medical issues. Please note that NO REFILLS for any discharge medications will be authorized once you are discharged, as it is imperative that you return to your primary care physician (or establish a relationship with a primary care physician if you do not have one).   Increase activity slowly   Complete by: As directed        Discharge Medications:   Allergies as of 06/16/2022   No Known Allergies      Medication List     STOP taking these medications    amLODipine 10 MG tablet Commonly known as: NORVASC   Aspirin Low Dose 81 MG tablet Generic drug: aspirin EC       TAKE these medications    Bystolic 20 MG Tabs Generic drug: Nebivolol HCl Take 20 mg by mouth daily.   ferrous sulfate 325 (65 FE) MG tablet Take 1 tablet (325 mg total) by mouth daily with breakfast.   Probiotic Daily Caps Take 1 capsule by mouth daily. Ultra Flora Probiotic - prescribed by Dr. Collene Mares   traMADol 50 MG tablet Commonly known as: ULTRAM Take 1 tablet (50 mg total) by mouth every 8 (eight) hours as needed for up to 5 days (mild pain).         The results of significant diagnostics from this hospitalization (including imaging, microbiology, ancillary and laboratory) are listed below for reference.    Procedures and Diagnostic Studies:   CT ABDOMEN PELVIS W CONTRAST  Result Date: 06/11/2022 CLINICAL DATA:  Abdominal pain.  Known hepatic masses. EXAM: CT ABDOMEN AND PELVIS WITH CONTRAST TECHNIQUE: Multidetector CT imaging of the abdomen and  pelvis was performed using the standard protocol following bolus administration of intravenous contrast. RADIATION DOSE REDUCTION: This exam was performed according to the departmental dose-optimization program which includes automated exposure control, adjustment of the mA and/or kV according to patient size and/or use of iterative reconstruction technique. CONTRAST:  79m OMNIPAQUE IOHEXOL 350 MG/ML SOLN COMPARISON:  CT scan 03/17/2022 and MRI 04/28/2022 FINDINGS: Lower chest: See moderate-sized right pleural effusion with overlying atelectasis. Heart is normal in size. No pericardial effusion. Coronary artery calcifications are noted. Hepatobiliary: Enlarged liver with multiple hepatic masses. Partially necrotic large mass occupying most of the right hepatic lobe. The gallbladder contains numerous small gallstones. No common bile  duct dilatation. Pancreas: No mass, inflammation or ductal dilatation. Spleen: Normal size.  No focal lesions. Adrenals/Urinary Tract: The adrenal glands unremarkable. No worrisome renal lesions hydronephrosis. Stable right renal calculus. The bladder is. Stomach/Bowel: The stomach, duodenum, small bowel and colon grossly. No obstructive findings. Vascular/Lymphatic: Stable scattered atherosclerotic calcifications involving the aorta and iliac arteries. The major venous structures are patent. Stable scattered mesenteric and retroperitoneal lymph nodes. Reproductive: The prostate gland and seminal vesicles are unremarkable. Other: New moderate volume abdominal/pelvic ascites. Higher attenuation fluid in the right upper quadrant and tracking down the right pericolic gutter and into the dependent portion of the pelvis. Possible bleeding from one of the liver lesions. Has there been a recent liver biopsy? Musculoskeletal: No significant bony findings. IMPRESSION: 1. New moderate volume abdominal/pelvic ascites with higher attenuation fluid in the right upper quadrant and tracking down the  right paracolic gutter and into the dependent portion of the pelvis. Possible bleeding from one of the liver lesions. Has there been a recent liver biopsy? 2. Enlarged liver with multiple hepatic masses. 3. Cholelithiasis. 4. Stable right renal calculus. 5. Aortic atherosclerosis. Aortic Atherosclerosis (ICD10-I70.0). Electronically Signed   By: Marijo Sanes M.D.   On: 06/11/2022 12:10   DG Chest 2 View  Result Date: 06/10/2022 CLINICAL DATA:  Shortness of breath. Weakness. EXAM: CHEST - 2 VIEW COMPARISON:  Radiograph 06/23/2018 lung bases from abdominal MRI 04/28/2022 FINDINGS: Right pleural effusion with associated basilar airspace disease. Additional right perihilar atelectasis/scarring. The heart is normal in size with stable mediastinal contours. The left lung is clear. No pneumothorax. IMPRESSION: Right pleural effusion which is likely in part chronic. Associated basilar airspace disease may represent atelectasis or pneumonia. Additional right perihilar linear atelectasis or scar. Electronically Signed   By: Tesla Rake M.D.   On: 06/10/2022 15:57     Labs:   Basic Metabolic Panel: Recent Labs  Lab 06/12/22 0115 06/12/22 0946 06/13/22 0226 06/14/22 0321 06/15/22 0205  NA 135 136 132* 135 137  K 5.1 4.8 4.1 3.9 4.2  CL 101 105 103 105 105  CO2 '26 22 23 23 23  '$ GLUCOSE 92 88 95 93 88  BUN 21* '20 18 12 11  '$ CREATININE 2.24* 2.03* 1.89* 1.61* 1.52*  CALCIUM 8.8* 8.5* 8.5* 8.5* 8.6*  MG  --   --  2.0  --   --    GFR Estimated Creatinine Clearance: 54 mL/min (A) (by C-G formula based on SCr of 1.52 mg/dL (H)). Liver Function Tests: Recent Labs  Lab 06/10/22 1529 06/12/22 0115 06/12/22 0946 06/13/22 0226 06/15/22 0205  AST 97* 87* 87* 90* 83*  ALT 123* 108* 107* 106* 96*  ALKPHOS 66 56 54 56 58  BILITOT 1.5* 1.6* 1.6* 1.1 1.3*  PROT 6.2* 5.3* 5.4* 5.6* 5.5*  ALBUMIN 2.4* 2.0* 1.9* 2.0* 1.9*   Recent Labs  Lab 06/10/22 1529  LIPASE 35   No results for input(s):  "AMMONIA" in the last 168 hours. Coagulation profile Recent Labs  Lab 06/11/22 1355  INR 1.1    CBC: Recent Labs  Lab 06/10/22 1529 06/11/22 1355 06/12/22 0115 06/13/22 0226 06/15/22 0205  WBC 8.1 5.7 5.4 5.1 5.3  NEUTROABS 6.5  --   --  2.6 2.9  HGB 10.0* 8.7* 8.2* 8.2* 8.3*  HCT 30.7* 25.4* 23.8* 25.0* 25.2*  MCV 82.5 80.9 79.3* 81.7 82.4  PLT 567* 415* 378 368 391   Cardiac Enzymes: No results for input(s): "CKTOTAL", "CKMB", "CKMBINDEX", "TROPONINI" in the last 168 hours.  BNP: Invalid input(s): "POCBNP" CBG: No results for input(s): "GLUCAP" in the last 168 hours. D-Dimer No results for input(s): "DDIMER" in the last 72 hours. Hgb A1c No results for input(s): "HGBA1C" in the last 72 hours. Lipid Profile No results for input(s): "CHOL", "HDL", "LDLCALC", "TRIG", "CHOLHDL", "LDLDIRECT" in the last 72 hours. Thyroid function studies No results for input(s): "TSH", "T4TOTAL", "T3FREE", "THYROIDAB" in the last 72 hours.  Invalid input(s): "FREET3" Anemia work up No results for input(s): "VITAMINB12", "FOLATE", "FERRITIN", "TIBC", "IRON", "RETICCTPCT" in the last 72 hours. Microbiology No results found for this or any previous visit (from the past 240 hour(s)).  Time coordinating discharge: 35 minutes  Signed: Maghan Jessee  [facility] 06/17/2022, 3:01 PM

## 2022-06-16 NOTE — Progress Notes (Signed)
HEMATOLOGY/ONCOLOGY CONSULTATION NOTE  Date of Service: 06/16/2022  Patient Care Team: Lucianne Lei, MD as PCP - General (Family Medicine)  CHIEF COMPLAINTS/PURPOSE OF CONSULTATION:  Newly diagnosed hepatocellular carcinoma  HISTORY OF PRESENTING ILLNESS:   Mark Villarreal is a wonderful 61 y.o. male who has been referred to [place] by Dr .Pietro Cassis for evaluation and management of newly diagnosed hepatocellular carcinoma.  Patient has a history of hepatitis C treated by Terri Piedra DNP, liver cirrhosis followed by Dr. Collene Mares. He was seen by me in clinic in 2021 for thrombocytopenia and was diagnosed with hepatitis C at that time. He presented to his gastroenterologist with abdominal bloating and right upper quadrant discomfort and had a CT abdomen on March 17, 2022 which showed concerns for liver lesions. He subsequently had an MRI of the liver with Dr. Collene Mares on 04/28/2022 which showed findings highly suggestive of primary hepatic neoplasm/multifocal hepatocellular carcinoma.  He was also noted to have right-sided pleural effusion.  He was referred to the cancer center and was contacted by the GI navigator on September 14 but declined consultation with oncology.  He subsequently presented to the emergency room on 06/11/2022 with increasing abdominal pain and weakness, anorexia, early satiety and nausea. He had a CT of the abdomen pelvis on 06/11/2022 which showed moderate volume abdominal and pelvic ascites with high attenuation fluid tracking down the right paracolic gutter into the dependent portion of the pelvis.  Enlarged liver with multiple liver masses.  There was concern regarding bleeding from one of the liver lesions. CT chest was done on 06/12/2022 which showed no definite evidence of metastatic disease in the chest.  Moderate-sized right pleural effusion with right lower lobe atelectasis.  Patient had a therapeutic paracentesis which was noted to show bloody liquid.  It was not sent  for any diagnostic testing. Patient had AFP tumor marker which was significant elevated at 4291 and a CA 19-9 which is slightly weighted at 482.  Patient's hemoglobin was noted to have dropped to the 8.2 but since has remained relatively stable.  Patient's abdominal pain has improved after therapeutic paracentesis and currently notes some mild weakness but no uncontrolled pain no acute shortness of breath or chest pain. Patient notes some unquantified weight loss.  I had a detailed discussion with him today regarding his likely diagnosis of metastatic hepatocellular carcinoma which is consistent with his imaging studies and significantly elevated AFP tumor marker. We discussed that at this time liver biopsy is not clearly indicated especially with risk of bleeding.  We discussed that all treatments will be palliative and discussed options for best supportive care through hospice versus consideration of further palliative treatments.  We also discussed that in the setting of bleeding risk we would have difficulties using a TKI or Avastin due to increased risk of bleeding. Also given his extensive liver disease and disease outside of the liver with likely malignant ascites and malignant pleural effusion he would not be the best candidate at this time for liver directed therapies.  Patient understandably concerned and surprised by his poor prognosis. He is agreeable to follow-up with [place] in the cancer clinic.  MEDICAL HISTORY:  Past Medical History:  Diagnosis Date   Acute kidney injury ([place])    Arthritis    knee and neck   Chest pain 06/23/2018   Cholelithiasis 05/2018   COVID 2022   mild case   GERD (gastroesophageal reflux disease)    Hepatic steatosis    Hypertension  SURGICAL HISTORY: Past Surgical History:  Procedure Laterality Date   COLONOSCOPY     IR PARACENTESIS  06/15/2022   RADIOLOGY WITH ANESTHESIA N/A 04/28/2022   Procedure: MRI abdomen with and without contrast WITH  ANESTHESIA;  Surgeon: Radiologist, Medication, MD;  Location: [facility];  Service: Radiology;  Laterality: N/A;   WRIST SURGERY Right     SOCIAL HISTORY: Social History   Socioeconomic History   Marital status: Married    Spouse name: Not on file   Number of children: Not on file   Years of education: Not on file   Highest education level: Not on file  Occupational History   Not on file  Tobacco Use   Smoking status: Never   Smokeless tobacco: Never  Vaping Use   Vaping Use: Never used  Substance and Sexual Activity   Alcohol use: Yes    Comment: socially   Drug use: Never   Sexual activity: Yes  Other Topics Concern   Not on file  Social History Narrative   Not on file   Social Determinants of Health   Financial Resource Strain: Not on file  Food Insecurity: Not on file  Transportation Needs: Not on file  Physical Activity: Not on file  Stress: Not on file  Social Connections: Not on file  Intimate Partner Violence: Not on file    FAMILY HISTORY: History reviewed. No pertinent family history.  ALLERGIES:  has No Known Allergies.  MEDICATIONS:  Current Facility-Administered Medications  Medication Dose Route Frequency Provider Last Rate Last Admin   0.9 %  sodium chloride infusion  250 mL Intravenous PRN Orma Flaming, MD       ascorbic acid (VITAMIN C) tablet 500 mg  500 mg Oral Daily Florencia Reasons, MD   500 mg at 06/16/22 9485   fentaNYL (SUBLIMAZE) injection 25 mcg  25 mcg Intravenous Once Sharyn Lull A, PA-C       ferrous sulfate tablet 325 mg  325 mg Oral Q breakfast Florencia Reasons, MD   325 mg at 06/16/22 0926   ondansetron (ZOFRAN) tablet 4 mg  4 mg Oral Q6H PRN Orma Flaming, MD       Or   ondansetron [facility]) injection 4 mg  4 mg Intravenous Q6H PRN Orma Flaming, MD       ondansetron (ZOFRAN-ODT) disintegrating tablet 4 mg  4 mg Oral Once Leaphart, Kenneth T, PA-C       sodium chloride flush (NS) 0.9 % injection 3 mL  3 mL Intravenous Q12H Orma Flaming, MD    3 mL at 06/16/22 0926   sodium chloride flush (NS) 0.9 % injection 3 mL  3 mL Intravenous PRN Orma Flaming, MD       traMADol Veatrice Bourbon) tablet 50 mg  50 mg Oral Q8H PRN Orma Flaming, MD        REVIEW OF SYSTEMS:    10 Point review of Systems was done is negative except as noted above.  PHYSICAL EXAMINATION: ECOG PERFORMANCE STATUS: 1  . Vitals:   06/16/22 0538 06/16/22 0840  BP: 129/84 125/88  Pulse: 88 83  Resp:  18  Temp: 98.2 F (36.8 C) 98 F (36.7 C)  SpO2: 97% 99%   Filed Weights   06/10/22 1512  Weight: 183 lb (83 kg)   .Body mass index is 26.26 kg/m.  GENERAL:alert, in no acute distress and comfortable SKIN: no acute rashes, no significant lesions EYES: conjunctiva are pink and non-injected, sclera anicteric OROPHARYNX: MMM, no exudates, no  oropharyngeal erythema or ulceration NECK: supple, no JVD LYMPH:  no palpable lymphadenopathy in the cervical, axillary or inguinal regions LUNGS: clear to auscultation b/l, decreased breath sounds right lung base. HEART: regular rate & rhythm ABDOMEN:  normoactive bowel sounds , tenderness to palpation right upper quadrant.  No guarding no rigidity no rebound Extremity: no pedal edema PSYCH: alert & oriented x 3 with fluent speech NEURO: no focal motor/sensory deficits  LABORATORY DATA:  I have reviewed the data as listed .    Latest Ref Rng & Units 06/15/2022    2:05 AM 06/13/2022    2:26 AM 06/12/2022    1:15 AM  CBC  WBC 4.0 - 10.5 K/uL 5.3  5.1  5.4   Hemoglobin 13.0 - 17.0 g/dL 8.3  8.2  8.2   Hematocrit 39.0 - 52.0 % 25.2  25.0  23.8   Platelets 150 - 400 K/uL 391  368  378    .    Latest Ref Rng & Units 06/15/2022    2:05 AM 06/14/2022    3:21 AM 06/13/2022    2:26 AM  CMP  Glucose 70 - 99 mg/dL 88  93  95   BUN 6 - 20 mg/dL '11  12  18   '$ Creatinine 0.61 - 1.24 mg/dL 1.52  1.61  1.89   Sodium 135 - 145 mmol/L 137  135  132   Potassium 3.5 - 5.1 mmol/L 4.2  3.9  4.1   Chloride 98 - 111 mmol/L  105  105  103   CO2 22 - 32 mmol/L '23  23  23   '$ Calcium 8.9 - 10.3 mg/dL 8.6  8.5  8.5   Total Protein 6.5 - 8.1 g/dL 5.5   5.6   Total Bilirubin 0.3 - 1.2 mg/dL 1.3   1.1   Alkaline Phos 38 - 126 U/L 58   56   AST 15 - 41 U/L 83   90   ALT 0 - 44 U/L 96   106    CA 19-9-   482 AFP tumor marker-4291   RADIOGRAPHIC STUDIES: I have personally reviewed the radiological images as listed and agreed with the findings in the report. IR Paracentesis  Result Date: 06/15/2022 INDICATION: Moderate volume abdominal/pelvic ascites with possible bleeding from liver lesion. Request for therapeutic paracentesis EXAM: ULTRASOUND GUIDED right lower quadrant PARACENTESIS MEDICATIONS: 8 cc 1% lidocaine COMPLICATIONS: None immediate. PROCEDURE: Informed written consent was obtained from the patient after a discussion of the risks, benefits and alternatives to treatment. A timeout was performed prior to the initiation of the procedure. Initial ultrasound scanning demonstrates a large amount of ascites within the right lower abdominal quadrant. The right lower abdomen was prepped and draped in the usual sterile fashion. 1% lidocaine was used for local anesthesia. Following this, a 19 gauge, 7-cm, Yueh catheter was introduced. An ultrasound image was saved for documentation purposes. The paracentesis was performed. The catheter was removed and a dressing was applied. The patient tolerated the procedure well without immediate post procedural complication. FINDINGS: A total of approximately 2 L of grossly bloody ascitic fluid was removed. Quality of the collected fluid was communicated to the ordering provider. Ordering provider did not request laboratory samples IMPRESSION: Successful ultrasound-guided paracentesis yielding 2 liters of grossly bloody peritoneal fluid. Given other imaging findings, this is highly concerning for recent rupture of hepatocellular carcinoma. Patient is likely at risk for recurrent rupture. Read  by: Reatha Armour, PA-C Electronically Signed   By: Myrle Sheng  Laurence Ferrari M.D.   On: 06/15/2022 17:02   CT CHEST W CONTRAST  Result Date: 06/12/2022 CLINICAL DATA:  Hepatocellular carcinoma, staging. * Tracking Code: BO * EXAM: CT CHEST WITH CONTRAST TECHNIQUE: Multidetector CT imaging of the chest was performed during intravenous contrast administration. RADIATION DOSE REDUCTION: This exam was performed according to the departmental dose-optimization program which includes automated exposure control, adjustment of the mA and/or kV according to patient size and/or use of iterative reconstruction technique. CONTRAST:  14m OMNIPAQUE IOHEXOL 350 MG/ML SOLN COMPARISON:  Chest radiographs 06/10/2022. Abdominopelvic CT 06/11/2022 and 03/17/2022. Abdominal MRI 04/28/2022. FINDINGS: Cardiovascular: Diffuse coronary artery atherosclerosis without acute vascular findings. The heart size is normal. There is no pericardial effusion. Mediastinum/Nodes: There are no enlarged mediastinal, hilar or axillary lymph nodes. The thyroid gland, trachea and esophagus demonstrate no significant findings. Lungs/Pleura: Moderate-sized dependent right pleural effusion, unchanged from recent abdominal CT. Associated right lower lobe atelectasis. There are scattered small pulmonary nodules, largest anteriorly in the right upper lobe measuring 3 mm on image 38/4. Some of these nodules are perifissural along the minor fissure. There is a perifissural nodule along the superior aspect of the left major fissure measuring 2 mm on image 43/4. No highly suspicious pulmonary nodules. Upper abdomen: The visualized upper abdomen appears unchanged from the recent abdominal CT. Extensive infiltrating tumor throughout the liver and underlying cirrhosis are noted. There is a moderate volume of ascites. Marked extrinsic compression of the IVC which appears nearly occluded. Musculoskeletal/Chest wall: There is no chest wall mass or suspicious osseous finding.  Mild bilateral gynecomastia. Mild thoracic spondylosis. IMPRESSION: 1. No definite evidence of metastatic disease in the chest. 2. Scattered small pulmonary nodules bilaterally are nonspecific, although likely benign. Attention on follow-up recommended. 3. Moderate-sized right pleural effusion with right lower lobe atelectasis. 4. Unchanged appearance of the visualized upper abdomen compared with CT performed yesterday. Extensive infiltrating tumor throughout the liver with underlying cirrhosis and moderate volume of ascites. Marked extrinsic compression of the IVC which appears nearly occluded. Electronically Signed   By: WRichardean SaleM.D.   On: 06/12/2022 14:17   CT ABDOMEN PELVIS W CONTRAST  Result Date: 06/11/2022 CLINICAL DATA:  Abdominal pain.  Known hepatic masses. EXAM: CT ABDOMEN AND PELVIS WITH CONTRAST TECHNIQUE: Multidetector CT imaging of the abdomen and pelvis was performed using the standard protocol following bolus administration of intravenous contrast. RADIATION DOSE REDUCTION: This exam was performed according to the departmental dose-optimization program which includes automated exposure control, adjustment of the mA and/or kV according to patient size and/or use of iterative reconstruction technique. CONTRAST:  52mOMNIPAQUE IOHEXOL 350 MG/ML SOLN COMPARISON:  CT scan 03/17/2022 and MRI 04/28/2022 FINDINGS: Lower chest: See moderate-sized right pleural effusion with overlying atelectasis. Heart is normal in size. No pericardial effusion. Coronary artery calcifications are noted. Hepatobiliary: Enlarged liver with multiple hepatic masses. Partially necrotic large mass occupying most of the right hepatic lobe. The gallbladder contains numerous small gallstones. No common bile duct dilatation. Pancreas: No mass, inflammation or ductal dilatation. Spleen: Normal size.  No focal lesions. Adrenals/Urinary Tract: The adrenal glands unremarkable. No worrisome renal lesions hydronephrosis.  Stable right renal calculus. The bladder is. Stomach/Bowel: The stomach, duodenum, small bowel and colon grossly. No obstructive findings. Vascular/Lymphatic: Stable scattered atherosclerotic calcifications involving the aorta and iliac arteries. The major venous structures are patent. Stable scattered mesenteric and retroperitoneal lymph nodes. Reproductive: The prostate gland and seminal vesicles are unremarkable. Other: New moderate volume abdominal/pelvic ascites. Higher attenuation fluid in the right  upper quadrant and tracking down the right pericolic gutter and into the dependent portion of the pelvis. Possible bleeding from one of the liver lesions. Has there been a recent liver biopsy? Musculoskeletal: No significant bony findings. IMPRESSION: 1. New moderate volume abdominal/pelvic ascites with higher attenuation fluid in the right upper quadrant and tracking down the right paracolic gutter and into the dependent portion of the pelvis. Possible bleeding from one of the liver lesions. Has there been a recent liver biopsy? 2. Enlarged liver with multiple hepatic masses. 3. Cholelithiasis. 4. Stable right renal calculus. 5. Aortic atherosclerosis. Aortic Atherosclerosis (ICD10-I70.0). Electronically Signed   By: Marijo Sanes M.D.   On: 06/11/2022 12:10   DG Chest 2 View  Result Date: 06/10/2022 CLINICAL DATA:  Shortness of breath. Weakness. EXAM: CHEST - 2 VIEW COMPARISON:  Radiograph 06/23/2018 lung bases from abdominal MRI 04/28/2022 FINDINGS: Right pleural effusion with associated basilar airspace disease. Additional right perihilar atelectasis/scarring. The heart is normal in size with stable mediastinal contours. The left lung is clear. No pneumothorax. IMPRESSION: Right pleural effusion which is likely in part chronic. Associated basilar airspace disease may represent atelectasis or pneumonia. Additional right perihilar linear atelectasis or scar. Electronically Signed   By: Rayquan Rake M.D.    On: 06/10/2022 15:57    ASSESSMENT & PLAN:   61 y.o. very pleasant male with history of hepatitis C with liver cirrhosis with  1.  Likely multifocal extensive hepatocellular carcinoma with malignant ascites and malignant pleural effusion. AFP tumor marker 4291 CA 19-9 024 Tumor is complicated with likely bleeding from his necrotic liver tumor with hemorrhagic ascites. Hemoglobin currently has been stable for the last few days.  #2 likely malignant ascites.  Status post therapeutic paracentesis.  Diagnostics labs were not sent out.  #3 right pleural effusion concerning for likely hepatic hydrothorax versus malignant pleural effusion. Need to be tapped if patient has shortness of breath.  #4 history of hepatitis C status posttreatment with Terri Piedra #5 history of liver cirrhosis related to hepatitis C following with Dr. Juanita Craver.  #6 acute on chronic anemia-likely multifactorial from anemia of chronic inflammation, renal insufficiency, hepatocellular carcinoma and potentially form bleeding into the peritoneal cavity. Plan -I discussed all the available lab results and imaging studies in detail with the patient. -We discussed that based on imaging studies and significantly elevated AFP tumor marker this is virtually diagnostic of metastatic multifocal hepatocellular carcinoma in the context of his hepatitis C and liver cirrhosis. -We discussed that given the extensiveness of his malignancy his disease is not resectable and he may not be the best candidate for local liver directed treatments. -We will set him up for outpatient follow-up with [place] in about a week to monitor for transfusion support needs and to start him on atezolizumab and if no issues with bleeding then Avastin. -Discussed option for best supportive cares through hospice but the patient wants to hold off on this option currently.  He is and his wife are aware of the risk of rebleeding from his liver tumor. -Might need to  do therapeutic thoracentesis and repeat paracentesis with diagnostic studies. -Continued goals of care discussion.  Follow up: Return to clinic with Dr. Irene Limbo with labs in 1 week  The total time spent in the appointment was 60 minutes*.  All of the patient's questions were answered with apparent satisfaction. The patient knows to call the clinic with any problems, questions or concerns.   Sullivan Lone MD [place] AAHIVMS [facility] Hematology/Oncology  Physician [facility]  .*Total Encounter Time as defined by the Centers for Medicare and Medicaid Services includes, in addition to the face-to-face time of a patient visit (documented in the note above) non-face-to-face time: obtaining and reviewing outside history, ordering and reviewing medications, tests or procedures, care coordination (communications with other health care professionals or caregivers) and documentation in the medical record.   I, Melene Muller, am acting as scribe for Fabienne Bruns, MD.   .I have reviewed the above documentation for accuracy and completeness, and I agree with the above. Marland Kitchen

## 2022-06-16 NOTE — Progress Notes (Signed)
START OFF PATHWAY REGIMEN - Other   [id number]:Atezolizumab 1,200 mg IV D1 + Bevacizumab 15 mg/kg IV D1 q21 Days:   A cycle is every 21 Days:     Atezolizumab      Bevacizumab-xxxx   **Always confirm dose/schedule in your pharmacy ordering system**  Patient Characteristics: Intent of Therapy: Non-Curative / Palliative Intent, Discussed with Patient 

## 2022-06-17 ENCOUNTER — Telehealth: Payer: Self-pay | Admitting: Hematology

## 2022-06-17 NOTE — Telephone Encounter (Signed)
Scheduled follow-up appointments per appointment request workqueue. Patient is aware. 

## 2022-06-17 NOTE — Telephone Encounter (Signed)
Scheduled appt per 10/18 staff msg from Dr. Irene Limbo. Pt is aware of appt date and time. Pt is aware to arrive 15 mins prior to appt time and to bring and updated insurance card. Pt is aware of appt location.

## 2022-06-18 ENCOUNTER — Other Ambulatory Visit: Payer: Self-pay

## 2022-06-23 ENCOUNTER — Other Ambulatory Visit: Payer: 59

## 2022-06-23 ENCOUNTER — Inpatient Hospital Stay: Payer: 59

## 2022-06-23 ENCOUNTER — Inpatient Hospital Stay: Payer: 59 | Admitting: Hematology

## 2022-06-24 ENCOUNTER — Other Ambulatory Visit: Payer: Self-pay

## 2022-06-24 ENCOUNTER — Inpatient Hospital Stay: Payer: 59

## 2022-06-24 ENCOUNTER — Inpatient Hospital Stay: Payer: 59 | Attending: Hematology

## 2022-06-24 NOTE — Progress Notes (Signed)
Pt called and canceled all appointments yesterday for lab, MD visit and chemo education. Multiple attempts made to reach pt to attempt to reschedule were made by this RN and scheduling. Pt was no show today for chemo appointment. Pt again unable to be reached./ Pt has been left on his voicemail how to get in touch to reschedule if he desires. If pt does not reach out he will be sent a letter to discharge him from this practice.

## 2022-08-22 ENCOUNTER — Inpatient Hospital Stay (HOSPITAL_COMMUNITY): Payer: 59

## 2022-08-22 ENCOUNTER — Emergency Department (HOSPITAL_COMMUNITY): Payer: 59

## 2022-08-22 ENCOUNTER — Inpatient Hospital Stay (HOSPITAL_COMMUNITY)
Admission: EM | Admit: 2022-08-22 | Discharge: 2022-08-31 | DRG: 308 | Disposition: E | Payer: 59 | Attending: Pulmonary Disease | Admitting: Pulmonary Disease

## 2022-08-22 DIAGNOSIS — I2699 Other pulmonary embolism without acute cor pulmonale: Secondary | ICD-10-CM | POA: Diagnosis present

## 2022-08-22 DIAGNOSIS — C220 Liver cell carcinoma: Secondary | ICD-10-CM | POA: Diagnosis present

## 2022-08-22 DIAGNOSIS — J9601 Acute respiratory failure with hypoxia: Secondary | ICD-10-CM | POA: Diagnosis present

## 2022-08-22 DIAGNOSIS — I10 Essential (primary) hypertension: Secondary | ICD-10-CM | POA: Diagnosis present

## 2022-08-22 DIAGNOSIS — I469 Cardiac arrest, cause unspecified: Secondary | ICD-10-CM | POA: Diagnosis present

## 2022-08-22 DIAGNOSIS — I472 Ventricular tachycardia, unspecified: Secondary | ICD-10-CM | POA: Diagnosis present

## 2022-08-22 DIAGNOSIS — K7460 Unspecified cirrhosis of liver: Secondary | ICD-10-CM | POA: Diagnosis present

## 2022-08-22 DIAGNOSIS — Z6826 Body mass index (BMI) 26.0-26.9, adult: Secondary | ICD-10-CM | POA: Diagnosis not present

## 2022-08-22 DIAGNOSIS — E46 Unspecified protein-calorie malnutrition: Secondary | ICD-10-CM | POA: Diagnosis present

## 2022-08-22 DIAGNOSIS — E43 Unspecified severe protein-calorie malnutrition: Secondary | ICD-10-CM | POA: Diagnosis present

## 2022-08-22 DIAGNOSIS — Z79899 Other long term (current) drug therapy: Secondary | ICD-10-CM

## 2022-08-22 DIAGNOSIS — Z8619 Personal history of other infectious and parasitic diseases: Secondary | ICD-10-CM

## 2022-08-22 DIAGNOSIS — Z8616 Personal history of COVID-19: Secondary | ICD-10-CM | POA: Diagnosis not present

## 2022-08-22 DIAGNOSIS — I462 Cardiac arrest due to underlying cardiac condition: Secondary | ICD-10-CM | POA: Diagnosis present

## 2022-08-22 DIAGNOSIS — Z7982 Long term (current) use of aspirin: Secondary | ICD-10-CM | POA: Diagnosis not present

## 2022-08-22 DIAGNOSIS — I4901 Ventricular fibrillation: Secondary | ICD-10-CM | POA: Diagnosis present

## 2022-08-22 DIAGNOSIS — D649 Anemia, unspecified: Secondary | ICD-10-CM | POA: Diagnosis present

## 2022-08-22 LAB — BASIC METABOLIC PANEL
Anion gap: 16 — ABNORMAL HIGH (ref 5–15)
BUN: 25 mg/dL — ABNORMAL HIGH (ref 6–20)
CO2: 13 mmol/L — ABNORMAL LOW (ref 22–32)
Calcium: 7.8 mg/dL — ABNORMAL LOW (ref 8.9–10.3)
Chloride: 97 mmol/L — ABNORMAL LOW (ref 98–111)
Creatinine, Ser: 2.26 mg/dL — ABNORMAL HIGH (ref 0.61–1.24)
GFR, Estimated: 33 mL/min — ABNORMAL LOW (ref >60)
Glucose, Bld: 216 mg/dL — ABNORMAL HIGH (ref 70–99)
Potassium: 4.7 mmol/L (ref 3.5–5.1)
Sodium: 126 mmol/L — ABNORMAL LOW (ref 135–145)

## 2022-08-22 LAB — CBC WITH DIFFERENTIAL/PLATELET
Abs Immature Granulocytes: 0 10*3/uL (ref 0.00–0.07)
Basophils Absolute: 0.1 10*3/uL (ref 0.0–0.1)
Basophils Relative: 1 %
Eosinophils Absolute: 0 10*3/uL (ref 0.0–0.5)
Eosinophils Relative: 0 %
HCT: 26.8 % — ABNORMAL LOW (ref 39.0–52.0)
Hemoglobin: 9 g/dL — ABNORMAL LOW (ref 13.0–17.0)
Lymphocytes Relative: 11 %
Lymphs Abs: 1.3 10*3/uL (ref 0.7–4.0)
MCH: 27.2 pg (ref 26.0–34.0)
MCHC: 33.6 g/dL (ref 30.0–36.0)
MCV: 81 fL (ref 80.0–100.0)
Monocytes Absolute: 0.5 10*3/uL (ref 0.1–1.0)
Monocytes Relative: 4 %
Neutro Abs: 9.7 10*3/uL — ABNORMAL HIGH (ref 1.7–7.7)
Neutrophils Relative %: 84 %
Platelets: 452 10*3/uL — ABNORMAL HIGH (ref 150–400)
RBC: 3.31 MIL/uL — ABNORMAL LOW (ref 4.22–5.81)
RDW: 18 % — ABNORMAL HIGH (ref 11.5–15.5)
WBC: 11.5 10*3/uL — ABNORMAL HIGH (ref 4.0–10.5)
nRBC: 0.3 % — ABNORMAL HIGH (ref 0.0–0.2)
nRBC: 2 /100 WBC — ABNORMAL HIGH

## 2022-08-22 LAB — ETHANOL: Alcohol, Ethyl (B): <10 mg/dL (ref <10)

## 2022-08-22 LAB — SALICYLATE LEVEL: Salicylate Lvl: <7 mg/dL — ABNORMAL LOW (ref 7.0–30.0)

## 2022-08-22 LAB — I-STAT VENOUS BLOOD GAS, ED
Acid-base deficit: 12 mmol/L — ABNORMAL HIGH (ref 0.0–2.0)
Bicarbonate: 14 mmol/L — ABNORMAL LOW (ref 20.0–28.0)
Calcium, Ion: 1.06 mmol/L — ABNORMAL LOW (ref 1.15–1.40)
HCT: 31 % — ABNORMAL LOW (ref 39.0–52.0)
Hemoglobin: 10.5 g/dL — ABNORMAL LOW (ref 13.0–17.0)
O2 Saturation: 100 %
Potassium: 4.8 mmol/L (ref 3.5–5.1)
Sodium: 124 mmol/L — ABNORMAL LOW (ref 135–145)
TCO2: 15 mmol/L — ABNORMAL LOW (ref 22–32)
pCO2, Ven: 32.6 mmHg — ABNORMAL LOW (ref 44–60)
pH, Ven: 7.239 — ABNORMAL LOW (ref 7.25–7.43)
pO2, Ven: 206 mmHg — ABNORMAL HIGH (ref 32–45)

## 2022-08-22 LAB — I-STAT ARTERIAL BLOOD GAS, ED
Acid-base deficit: 9 mmol/L — ABNORMAL HIGH (ref 0.0–2.0)
Bicarbonate: 16.5 mmol/L — ABNORMAL LOW (ref 20.0–28.0)
Calcium, Ion: 1.09 mmol/L — ABNORMAL LOW (ref 1.15–1.40)
HCT: 27 % — ABNORMAL LOW (ref 39.0–52.0)
Hemoglobin: 9.2 g/dL — ABNORMAL LOW (ref 13.0–17.0)
O2 Saturation: 99 %
Patient temperature: 97.8
Potassium: 4.6 mmol/L (ref 3.5–5.1)
Sodium: 126 mmol/L — ABNORMAL LOW (ref 135–145)
TCO2: 17 mmol/L — ABNORMAL LOW (ref 22–32)
pCO2 arterial: 31.1 mmHg — ABNORMAL LOW (ref 32–48)
pH, Arterial: 7.332 — ABNORMAL LOW (ref 7.35–7.45)
pO2, Arterial: 153 mmHg — ABNORMAL HIGH (ref 83–108)

## 2022-08-22 LAB — RESP PANEL BY RT-PCR (RSV, FLU A&B, COVID)  RVPGX2
Influenza A by PCR: NEGATIVE
Influenza B by PCR: NEGATIVE
Resp Syncytial Virus by PCR: NEGATIVE
SARS Coronavirus 2 by RT PCR: NEGATIVE

## 2022-08-22 LAB — PROTIME-INR
INR: 1.4 — ABNORMAL HIGH (ref 0.8–1.2)
Prothrombin Time: 16.9 seconds — ABNORMAL HIGH (ref 11.4–15.2)

## 2022-08-22 LAB — ACETAMINOPHEN LEVEL: Acetaminophen (Tylenol), Serum: <10 ug/mL — ABNORMAL LOW (ref 10–30)

## 2022-08-22 LAB — MAGNESIUM: Magnesium: 2.1 mg/dL (ref 1.7–2.4)

## 2022-08-22 LAB — HEPATIC FUNCTION PANEL
ALT: 87 U/L — ABNORMAL HIGH (ref 0–44)
AST: 149 U/L — ABNORMAL HIGH (ref 15–41)
Albumin: <1.5 g/dL — ABNORMAL LOW (ref 3.5–5.0)
Alkaline Phosphatase: 76 U/L (ref 38–126)
Bilirubin, Direct: 0.3 mg/dL — ABNORMAL HIGH (ref 0.0–0.2)
Indirect Bilirubin: 0.4 mg/dL (ref 0.3–0.9)
Total Bilirubin: 0.7 mg/dL (ref 0.3–1.2)
Total Protein: 5.3 g/dL — ABNORMAL LOW (ref 6.5–8.1)

## 2022-08-22 LAB — TROPONIN I (HIGH SENSITIVITY): Troponin I (High Sensitivity): 537 ng/L (ref <18)

## 2022-08-22 LAB — BRAIN NATRIURETIC PEPTIDE: B Natriuretic Peptide: 125 pg/mL — ABNORMAL HIGH (ref 0.0–100.0)

## 2022-08-22 LAB — LACTIC ACID, PLASMA: Lactic Acid, Venous: 6.7 mmol/L (ref 0.5–1.9)

## 2022-08-22 MED ORDER — DEXTROSE-NACL 5-0.9 % IV SOLN: INTRAVENOUS | Status: DC

## 2022-08-22 MED ORDER — FENTANYL CITRATE PF 50 MCG/ML IJ SOSY: 100.0000 ug | PREFILLED_SYRINGE | INTRAMUSCULAR | Status: DC | PRN

## 2022-08-22 MED ORDER — VANCOMYCIN HCL 1500 MG/300ML IV SOLN
1500.0000 mg | Freq: Once | INTRAVENOUS | Status: DC
  Filled 2022-08-22: qty 300

## 2022-08-22 MED ORDER — NOREPINEPHRINE 4 MG/250ML-% IV SOLN
0.0000 ug/min | INTRAVENOUS | Status: DC
  Administered 2022-08-22: 10 ug/min via INTRAVENOUS

## 2022-08-22 MED ORDER — AMIODARONE HCL IN DEXTROSE 360-4.14 MG/200ML-% IV SOLN: 30.0000 mg/h | INTRAVENOUS | Status: DC

## 2022-08-22 MED ORDER — EPINEPHRINE 1 MG/10ML IJ SOSY
PREFILLED_SYRINGE | INTRAMUSCULAR | Status: DC | PRN
  Administered 2022-08-22: 1 mg via INTRAVENOUS

## 2022-08-22 MED ORDER — DOCUSATE SODIUM 50 MG/5ML PO LIQD: 100.0000 mg | Freq: Two times a day (BID) | ORAL | Status: DC | PRN

## 2022-08-22 MED ORDER — LACTATED RINGERS IV BOLUS: 1000.0000 mL | Freq: Once | INTRAVENOUS | Status: DC

## 2022-08-22 MED ORDER — DOCUSATE SODIUM 100 MG PO CAPS: 100.0000 mg | ORAL_CAPSULE | Freq: Two times a day (BID) | ORAL | Status: DC | PRN

## 2022-08-22 MED ORDER — ROCURONIUM BROMIDE 50 MG/5ML IV SOLN
INTRAVENOUS | Status: DC | PRN
  Administered 2022-08-22: 90 mg via INTRAVENOUS

## 2022-08-22 MED ORDER — POLYETHYLENE GLYCOL 3350 17 G PO PACK: 17.0000 g | PACK | Freq: Every day | ORAL | Status: DC | PRN

## 2022-08-22 MED ORDER — ETOMIDATE 2 MG/ML IV SOLN
INTRAVENOUS | Status: DC | PRN
  Administered 2022-08-22: 15 mg via INTRAVENOUS

## 2022-08-22 MED ORDER — EPINEPHRINE 1 MG/10ML IJ SOSY
PREFILLED_SYRINGE | INTRAMUSCULAR | Status: DC | PRN
  Administered 2022-08-22 (×8): 1 mg via INTRAVENOUS

## 2022-08-22 MED ORDER — SODIUM CHLORIDE 0.9 % IV SOLN: 250.0000 mL | INTRAVENOUS | Status: DC

## 2022-08-22 MED ORDER — LIDOCAINE HCL (CARDIAC) PF 100 MG/5ML IV SOSY
PREFILLED_SYRINGE | INTRAVENOUS | Status: DC | PRN
  Administered 2022-08-22: 100 mg via INTRAVENOUS

## 2022-08-22 MED ORDER — SODIUM BICARBONATE 8.4 % IV SOLN
50.0000 meq | Freq: Once | INTRAVENOUS | Status: AC
  Administered 2022-08-22: 50 meq via INTRAVENOUS

## 2022-08-22 MED ORDER — PHENTOLAMINE MESYLATE 5 MG IJ SOLR
5.0000 mg | Freq: Once | INTRAMUSCULAR | Status: DC
  Filled 2022-08-22: qty 5

## 2022-08-22 MED ORDER — SODIUM CHLORIDE 0.9 % IV SOLN: 2.0000 g | INTRAVENOUS | Status: DC

## 2022-08-22 MED ORDER — AMIODARONE HCL IN DEXTROSE 360-4.14 MG/200ML-% IV SOLN
60.0000 mg/h | INTRAVENOUS | Status: AC
  Administered 2022-08-22: 60 mg/h via INTRAVENOUS

## 2022-08-22 MED ORDER — SODIUM BICARBONATE 8.4 % IV SOLN
INTRAVENOUS | Status: DC | PRN
  Administered 2022-08-22 (×2): 50 meq via INTRAVENOUS

## 2022-08-22 MED ORDER — ONDANSETRON HCL 4 MG/2ML IJ SOLN: 4.0000 mg | Freq: Four times a day (QID) | INTRAMUSCULAR | Status: DC | PRN

## 2022-08-22 MED ORDER — AMIODARONE IV BOLUS ONLY 150 MG/100ML: 150.0000 mg | Freq: Once | INTRAVENOUS | Status: DC

## 2022-08-22 MED ORDER — FAMOTIDINE 20 MG PO TABS: 20.0000 mg | ORAL_TABLET | Freq: Two times a day (BID) | ORAL | Status: DC

## 2022-08-22 MED ORDER — NOREPINEPHRINE 4 MG/250ML-% IV SOLN
2.0000 ug/min | INTRAVENOUS | Status: DC
  Administered 2022-08-22: 24 ug/min via INTRAVENOUS
  Filled 2022-08-22: qty 250

## 2022-08-22 MED ORDER — FAMOTIDINE IN NACL 20-0.9 MG/50ML-% IV SOLN: 20.0000 mg | Freq: Two times a day (BID) | INTRAVENOUS | Status: DC

## 2022-08-22 MED ORDER — SODIUM CHLORIDE 0.9 % IV SOLN: 2.0000 g | Freq: Once | INTRAVENOUS | Status: DC

## 2022-08-22 MED ORDER — NITROGLYCERIN 2 % TD OINT: 1.0000 [in_us] | TOPICAL_OINTMENT | Freq: Three times a day (TID) | TRANSDERMAL | Status: DC

## 2022-08-22 MED ORDER — VANCOMYCIN HCL IN DEXTROSE 1-5 GM/200ML-% IV SOLN: 1000.0000 mg | INTRAVENOUS | Status: DC

## 2022-08-22 MED ORDER — CALCIUM CHLORIDE 10 % IV SOLN
INTRAVENOUS | Status: DC | PRN
  Administered 2022-08-22: 1 g via INTRAVENOUS

## 2022-08-31 NOTE — Code Documentation (Addendum)
PCCM Code Note:  Patient was in CT for chest/abd/pelvis scan where he went into cardiac arrest. He under went a total of 12 rounds of CPR. He never regained pulse with v. Fib and PEA activity on checks. He received amiodarone and defibrillation shocks along with epinephrine pushes throughout. He was given TPA for concern of pulmonary embolism given his cancer history. Despite these aggressive measures return of spontaneous circulation was not achieved. Patient passed away at 1524. Patient wife was updated in the consult room. Chaplain was offered and declined.   Please see code sheet for full details.  Freda Jackson, MD [facility] Office: [phone]   See Amion for personal pager PCCM on call pager [phone] until 7pm. Please call Elink 7p-7a. [phone]

## 2022-08-31 NOTE — ED Triage Notes (Signed)
PT BIB [facility] for witnessed arrest by wife. Bystander CPR started.   PT found to be in vfib arrest by EMS and called STEMI..  Total of 20 min CPR. Pt received 5 shocks, 3 epi, 450 amiodarone. Pt arrived on Epi drip. Pt received 1L NS and  153mg of fentanyl for sedation.  Pt had a king airway on arrival.   BP 122/102 CBG 114

## 2022-08-31 NOTE — ED Provider Notes (Addendum)
[facility] DEPARTMENT Provider Note   CSN: [id number] Arrival date & time: September 03, 2022  1127     History  No chief complaint on file.   Mark Villarreal is a 62 y.o. male.  HPI   62 year old male with medical history significant for chronic hepatitis C s/p treatment, liver cirrhosis, HTN, GERD, arthritis who presents to the emergency department after a witnessed V-fib arrest.  Bystander CPR was initiated on scene after a witnessed arrest by the patient's wife.  He was found to be in V-fib arrest by EMS and received a total of 20 minutes of CPR.  He received 5 shocks, 3 rounds of epinephrine, 450 mg of amiodarone.  He arrived on epi gtt. with a King airway in place.  He did receive 1 L normal saline and 100 mcg of fentanyl for sedation.  CBG with EMS 114.  On arrival, the patient was unresponsive, King airway in place. He had been activated as a CODE STEMI based on EMS 12 leads which was subsequently cancelled on patient arrival.  On chart review, the patient was found to have primary hepatic neoplasm, possible hepatocellular carcinoma, with the following findings on CT imaging: CT abdomen pelvis: IMPRESSION: 1. New moderate volume abdominal/pelvic ascites with higher attenuation fluid in the right upper quadrant and tracking down the right paracolic gutter and into the dependent portion of the pelvis. Possible bleeding from one of the liver lesions. Has there been a recent liver biopsy? 2. Enlarged liver with multiple hepatic masses. 3. Cholelithiasis. 4. Stable right renal calculus. 5. Aortic atherosclerosis.  CT Chest: IMPRESSION: 1. No definite evidence of metastatic disease in the chest. 2. Scattered small pulmonary nodules bilaterally are nonspecific, although likely benign. Attention on follow-up recommended. 3. Moderate-sized right pleural effusion with right lower lobe atelectasis. 4. Unchanged appearance of the visualized upper abdomen  compared with CT performed yesterday. Extensive infiltrating tumor throughout the liver with underlying cirrhosis and moderate volume of ascites. Marked extrinsic compression of the IVC which appears nearly occluded.  He underwent IR paracentesis while inpatient. He was discharged on 10/17 and was a no show for his subsequent oncology/chemotherapy appointments.    Home Medications Prior to Admission medications   Medication Sig Start Date End Date Taking? Authorizing Provider  aspirin EC 81 MG tablet Take 81 mg by mouth daily. Swallow whole.   Yes [provider]  BYSTOLIC 20 MG TABS Take 20 mg by mouth daily.  03/06/20  Yes [provider]  ferrous sulfate 325 (65 FE) MG tablet Take 1 tablet (325 mg total) by mouth daily with breakfast. 06/17/22 09/15/22 Yes Dahal, Marlowe Aschoff, MD  Probiotic Product (PROBIOTIC DAILY) CAPS Take 1 capsule by mouth daily. Ultra Flora Probiotic - prescribed by Dr. Collene Mares    Yes [provider]  prochlorperazine (COMPAZINE) 10 MG tablet Take 1 tablet (10 mg total) by mouth every 6 (six) hours as needed for nausea or vomiting. 06/16/22  Yes Brunetta Genera, MD  ondansetron (ZOFRAN) 8 MG tablet Take 1 tablet (8 mg total) by mouth every 8 (eight) hours as needed for nausea or vomiting. Patient not taking: Reported on 09/03/22 06/16/22   Brunetta Genera, MD      Allergies    Patient has no known allergies.    Review of Systems   Review of Systems  Unable to perform ROS: Mental status change    Physical Exam Updated Vital Signs BP 108/89   Pulse (!) 109   Temp  97.8 F (36.6 C) (Rectal)   Resp 20   Ht '5\' 10"'$  (1.778 m)   SpO2 98%   BMI 26.26 kg/m  Physical Exam Vitals and nursing note reviewed.  Constitutional:      General: He is in acute distress.     Comments: Unresponsive, king airway in place, GCS 3  HENT:     Head: Normocephalic and atraumatic.  Eyes:     Conjunctiva/sclera: Conjunctivae normal.     Comments:  Pupils pinpoint bilaterally  Neck:     Comments: C-Collar in place Cardiovascular:     Rate and Rhythm: Regular rhythm. Tachycardia present.  Pulmonary:     Effort: Pulmonary effort is normal. No respiratory distress.  Abdominal:     General: There is distension.     Tenderness: There is no guarding.     Comments: Abdomen markedly distended  Musculoskeletal:        General: No deformity or signs of injury.     Cervical back: Neck supple.  Skin:    Findings: No lesion or rash.  Neurological:     Comments: Unresponsive, GCS 3     ED Results / Procedures / Treatments   Labs (all labs ordered are listed, but only abnormal results are displayed) Labs Reviewed  CBC WITH DIFFERENTIAL/PLATELET - Abnormal; Notable for the following components:      Result Value   WBC 11.5 (*)    RBC 3.31 (*)    Hemoglobin 9.0 (*)    HCT 26.8 (*)    RDW 18.0 (*)    Platelets 452 (*)    nRBC 0.3 (*)    Neutro Abs 9.7 (*)    nRBC 2 (*)    All other components within normal limits  BASIC METABOLIC PANEL - Abnormal; Notable for the following components:   Sodium 126 (*)    Chloride 97 (*)    CO2 13 (*)    Glucose, Bld 216 (*)    BUN 25 (*)    Creatinine, Ser 2.26 (*)    Calcium 7.8 (*)    GFR, Estimated 33 (*)    Anion gap 16 (*)    All other components within normal limits  HEPATIC FUNCTION PANEL - Abnormal; Notable for the following components:   Total Protein 5.3 (*)    Albumin <1.5 (*)    AST 149 (*)    ALT 87 (*)    Bilirubin, Direct 0.3 (*)    All other components within normal limits  BRAIN NATRIURETIC PEPTIDE - Abnormal; Notable for the following components:   B Natriuretic Peptide 125.0 (*)    All other components within normal limits  LACTIC ACID, PLASMA - Abnormal; Notable for the following components:   Lactic Acid, Venous 6.7 (*)    All other components within normal limits  ACETAMINOPHEN LEVEL - Abnormal; Notable for the following components:   Acetaminophen (Tylenol),  Serum <10 (*)    All other components within normal limits  SALICYLATE LEVEL - Abnormal; Notable for the following components:   Salicylate Lvl <4.0 (*)    All other components within normal limits  PROTIME-INR - Abnormal; Notable for the following components:   Prothrombin Time 16.9 (*)    INR 1.4 (*)    All other components within normal limits  I-STAT ARTERIAL BLOOD GAS, ED - Abnormal; Notable for the following components:   pH, Arterial 7.332 (*)    pCO2 arterial 31.1 (*)    pO2, Arterial 153 (*)  Bicarbonate 16.5 (*)    TCO2 17 (*)    Acid-base deficit 9.0 (*)    Sodium 126 (*)    Calcium, Ion 1.09 (*)    HCT 27.0 (*)    Hemoglobin 9.2 (*)    All other components within normal limits  I-STAT VENOUS BLOOD GAS, ED - Abnormal; Notable for the following components:   pH, Ven 7.239 (*)    pCO2, Ven 32.6 (*)    pO2, Ven 206 (*)    Bicarbonate 14.0 (*)    TCO2 15 (*)    Acid-base deficit 12.0 (*)    Sodium 124 (*)    Calcium, Ion 1.06 (*)    HCT 31.0 (*)    Hemoglobin 10.5 (*)    All other components within normal limits  TROPONIN I (HIGH SENSITIVITY) - Abnormal; Notable for the following components:   Troponin I (High Sensitivity) 537 (*)    All other components within normal limits  RESP PANEL BY RT-PCR (RSV, FLU A&B, COVID)  RVPGX2  URINE CULTURE  CULTURE, RESPIRATORY W GRAM STAIN  ETHANOL  MAGNESIUM  LACTIC ACID, PLASMA  URINALYSIS, ROUTINE W REFLEX MICROSCOPIC  RAPID URINE DRUG SCREEN, HOSP PERFORMED  PHOSPHORUS  LEGIONELLA PNEUMOPHILA SEROGP 1 UR AG  STREP PNEUMONIAE URINARY ANTIGEN  URINALYSIS, ROUTINE W REFLEX MICROSCOPIC  TROPONIN I (HIGH SENSITIVITY)    EKG EKG Interpretation  Date/Time:  08-30-22 11:34:57 EST Ventricular Rate:  113 PR Interval:  138 QRS Duration: 104 QT Interval:  361 QTC Calculation: 495 R Axis:   36 Text Interpretation: Sinus tachycardia Low voltage, extremity leads Nonspecific T abnormalities, lateral leads  Borderline prolonged QT interval Reviewed with cardiology, no STEMI Confirmed by Regan Lemming ([id number]) on 08-30-22 11:48:11 AM  Radiology CT HEAD WO CONTRAST (5MM)  Result Date: 08-30-22 CLINICAL DATA:  Witnessed cardiac arrest.  CPR performed. EXAM: CT HEAD WITHOUT CONTRAST CT CERVICAL SPINE WITHOUT CONTRAST TECHNIQUE: Multidetector CT imaging of the head and cervical spine was performed following the standard protocol without intravenous contrast. Multiplanar CT image reconstructions of the cervical spine were also generated. RADIATION DOSE REDUCTION: This exam was performed according to the departmental dose-optimization program which includes automated exposure control, adjustment of the mA and/or kV according to patient size and/or use of iterative reconstruction technique. COMPARISON:  None Available. FINDINGS: CT HEAD FINDINGS Brain: Ventricles are normal in size and configuration. There are no parenchymal masses or mass effect. Patchy areas of white matter hypoattenuation are noted, predominantly along the subcortical white matter, that may all reflect chronic ischemic change. Acute/recent white matter infarcts, however, are not excluded. There is no evidence, however, involvement of the overlying cortex. No extra-axial masses or abnormal fluid collections. No intracranial hemorrhage. Vascular: No hyperdense vessel or unexpected calcification. Skull: Normal. Negative for fracture or focal lesion. Sinuses/Orbits: Globes and orbits are unremarkable. Visualized sinuses are clear. Other: Soft tissue air at the right skull base and visualized right upper neck. Small amount of air noted adjacent to the cavernous internal carotid arteries and posterior sella. CT CERVICAL SPINE FINDINGS Alignment: Normal. Skull base and vertebrae: No acute fracture. No primary bone lesion or focal pathologic process. Soft tissues and spinal canal: No prevertebral fluid or swelling. No visible canal hematoma. Soft tissue air  tracks along the right neck, seen also at the neck base. Source of this air is unclear. Disc levels: Minor loss of disc height at C3-C4. Mild loss of disc height at C5-C6 and C6-C7. Mild disc bulging with  small endplate spurs. No evidence of a disc herniation. Uncovertebral spurring causes mild right and moderate left neural foraminal narrowing at C6-C7. Upper chest: Patient intubated, tip of the ET tube below the included field of view. Right pleural effusion. Hazy opacities in the visualized upper lungs. Other: None. IMPRESSION: HEAD CT 1. Patchy areas predominantly subcortical white matter hypoattenuation that may all be due to chronic ischemic change. Acute/recent white matter infarction is not excluded, however. Consider follow-up brain MRI without contrast. 2. No intracranial hemorrhage. CERVICAL CT 1. No fracture or acute finding. 2. Nonspecific soft tissue air extending from the right skull base along the right neck to the neck base. Electronically Signed   By: Lajean Manes M.D.   On: 09-16-2022 13:49   CT Cervical Spine Wo Contrast  Result Date: 2022-09-16 CLINICAL DATA:  Witnessed cardiac arrest.  CPR performed. EXAM: CT HEAD WITHOUT CONTRAST CT CERVICAL SPINE WITHOUT CONTRAST TECHNIQUE: Multidetector CT imaging of the head and cervical spine was performed following the standard protocol without intravenous contrast. Multiplanar CT image reconstructions of the cervical spine were also generated. RADIATION DOSE REDUCTION: This exam was performed according to the departmental dose-optimization program which includes automated exposure control, adjustment of the mA and/or kV according to patient size and/or use of iterative reconstruction technique. COMPARISON:  None Available. FINDINGS: CT HEAD FINDINGS Brain: Ventricles are normal in size and configuration. There are no parenchymal masses or mass effect. Patchy areas of white matter hypoattenuation are noted, predominantly along the subcortical white  matter, that may all reflect chronic ischemic change. Acute/recent white matter infarcts, however, are not excluded. There is no evidence, however, involvement of the overlying cortex. No extra-axial masses or abnormal fluid collections. No intracranial hemorrhage. Vascular: No hyperdense vessel or unexpected calcification. Skull: Normal. Negative for fracture or focal lesion. Sinuses/Orbits: Globes and orbits are unremarkable. Visualized sinuses are clear. Other: Soft tissue air at the right skull base and visualized right upper neck. Small amount of air noted adjacent to the cavernous internal carotid arteries and posterior sella. CT CERVICAL SPINE FINDINGS Alignment: Normal. Skull base and vertebrae: No acute fracture. No primary bone lesion or focal pathologic process. Soft tissues and spinal canal: No prevertebral fluid or swelling. No visible canal hematoma. Soft tissue air tracks along the right neck, seen also at the neck base. Source of this air is unclear. Disc levels: Minor loss of disc height at C3-C4. Mild loss of disc height at C5-C6 and C6-C7. Mild disc bulging with small endplate spurs. No evidence of a disc herniation. Uncovertebral spurring causes mild right and moderate left neural foraminal narrowing at C6-C7. Upper chest: Patient intubated, tip of the ET tube below the included field of view. Right pleural effusion. Hazy opacities in the visualized upper lungs. Other: None. IMPRESSION: HEAD CT 1. Patchy areas predominantly subcortical white matter hypoattenuation that may all be due to chronic ischemic change. Acute/recent white matter infarction is not excluded, however. Consider follow-up brain MRI without contrast. 2. No intracranial hemorrhage. CERVICAL CT 1. No fracture or acute finding. 2. Nonspecific soft tissue air extending from the right skull base along the right neck to the neck base. Electronically Signed   By: Lajean Manes M.D.   On: 09/16/2022 13:49   DG Chest Port 1  View  Result Date: 09/16/22 CLINICAL DATA:  Intubation. EXAM: PORTABLE CHEST 1 VIEW COMPARISON:  06/10/2022 and older exams. FINDINGS: Endotracheal tube tip lies 1 cm above the carina. Heart is normal in size.  No mediastinal  or hilar masses. Low lung volumes. Hazy bilateral central airspace lung opacities are noted, most evident in the upper lobes. No convincing pneumothorax. Skeletal structures are grossly intact. IMPRESSION: 1. Well-positioned endotracheal tube. 2. Low lung volumes with hazy airspace opacities centrally, most prominently in the upper lobes. Findings may reflect pulmonary edema, multifocal pneumonia or atelectasis. Electronically Signed   By: Lajean Manes M.D.   On: 09/07/2022 12:09    Procedures Procedure Name: Intubation Date/Time: September 07, 2022 11:45 AM  Performed by: Regan Lemming, MDPre-anesthesia Checklist: Patient identified, Patient being monitored, Emergency Drugs available, Timeout performed and Suction available Oxygen Delivery Method: Ambu bag Preoxygenation: Pre-oxygenation with 100% oxygen Induction Type: Rapid sequence Ventilation: Oral airway inserted - appropriate to patient size Laryngoscope Size: Glidescope and 3 Grade View: Grade II Tube size: 7.5 mm Number of attempts: 1 Placement Confirmation: ETT inserted through vocal cords under direct vision, CO2 detector and Breath sounds checked- equal and bilateral Secured at: 24 cm Tube secured with: ETT holder    .Critical Care  Performed by: Regan Lemming, MD Authorized by: Regan Lemming, MD   Critical care provider statement:    Critical care time (minutes):  83   Critical care was necessary to treat or prevent imminent or life-threatening deterioration of the following conditions:  Shock   Critical care was time spent personally by me on the following activities:  Development of treatment plan with patient or surrogate, discussions with consultants, evaluation of patient's response to treatment,  examination of patient, ordering and review of laboratory studies, ordering and review of radiographic studies, ordering and performing treatments and interventions, pulse oximetry, re-evaluation of patient's condition and review of old charts   Care discussed with: admitting provider   .Central Line  Date/Time: 09/07/22 2:35 PM  Performed by: Regan Lemming, MD Authorized by: Regan Lemming, MD   Consent:    Consent obtained:  Emergent situation   Consent given by:  Spouse Universal protocol:    Patient identity confirmed:  Arm band Pre-procedure details:    Indication(s): central venous access     Skin preparation:  Chlorhexidine   Skin preparation agent: Skin preparation agent completely dried prior to procedure   Sedation:    Sedation type:  Deep Anesthesia:    Anesthesia method:  None Procedure details:    Location:  R femoral   Site selection rationale:  Beard and intubated, femoral site easiest access   Patient position:  Supine   Procedural supplies:  Triple lumen   Catheter size:  7 Fr   Landmarks identified: yes     Ultrasound guidance: yes     Ultrasound guidance timing: real time     Sterile ultrasound techniques: Sterile gel and sterile probe covers were used     Number of attempts:  1   Successful placement: yes   Post-procedure details:    Post-procedure:  Dressing applied and line sutured   Assessment:  Blood return through all ports and free fluid flow   Procedure completion:  Tolerated     Medications Ordered in ED Medications  etomidate (AMIDATE) injection (15 mg Intravenous Given September 07, 2022 1132)  rocuronium (ZEMURON) injection (90 mg Intravenous Given September 07, 2022 1132)  fentaNYL (SUBLIMAZE) injection 100 mcg (has no administration in time range)  fentaNYL (SUBLIMAZE) injection 100 mcg (has no administration in time range)  amiodarone (NEXTERONE PREMIX) 360-4.14 MG/200ML-% (1.8 mg/mL) IV infusion (60 mg/hr Intravenous New Bag/Given 09/07/22 1343)   amiodarone (NEXTERONE PREMIX) 360-4.14 MG/200ML-% (1.8 mg/mL) IV infusion (has no administration in  time range)  polyethylene glycol (MIRALAX / GLYCOLAX) packet 17 g (has no administration in time range)  famotidine (PEPCID) tablet 20 mg (has no administration in time range)  famotidine (PEPCID) IVPB 20 mg premix (has no administration in time range)  dextrose 5 %-0.9 % sodium chloride infusion (has no administration in time range)  ondansetron (ZOFRAN) injection 4 mg (has no administration in time range)  0.9 %  sodium chloride infusion (has no administration in time range)  norepinephrine (LEVOPHED) '4mg'$  in 246m (0.016 mg/mL) premix infusion (24 mcg/min Intravenous New Bag/Given 101/07/20241404)  docusate (COLACE) 50 MG/5ML liquid 100 mg (has no administration in time range)  phentolamine (REGITINE) injection 5 mg (has no administration in time range)  nitroGLYCERIN (NITROGLYN) 2 % ointment 1 inch (has no administration in time range)  amiodarone (NEXTERONE) IV bolus only 150 mg/100 mL (has no administration in time range)  lactated ringers bolus 1,000 mL (has no administration in time range)  ceFEPIme (MAXIPIME) 2 g in sodium chloride 0.9 % 100 mL IVPB (has no administration in time range)  vancomycin (VANCOREADY) IVPB 1500 mg/300 mL (has no administration in time range)  vancomycin (VANCOCIN) IVPB 1000 mg/200 mL premix (has no administration in time range)  ceFEPIme (MAXIPIME) 2 g in sodium chloride 0.9 % 100 mL IVPB (has no administration in time range)  sodium bicarbonate injection 50 mEq (50 mEq Intravenous Given 107-Jan-20241150)    ED Course/ Medical Decision Making/ A&P Clinical Course as of [id number]  S07-Jan-2024 1319 Troponin I (High Sensitivity)(!!): 537 [JL]    Clinical Course User Index [JL] LRegan Lemming MD                           Medical Decision Making Amount and/or Complexity of Data Reviewed Labs: ordered. Decision-making details documented in ED  Course. Radiology: ordered.  Risk Prescription drug management. Decision regarding hospitalization.    62year old male with medical history significant for chronic hepatitis C s/p treatment, liver cirrhosis, HTN, GERD, arthritis who presents to the emergency department after a witnessed V-fib arrest.  Bystander CPR was initiated on scene after a witnessed arrest by the patient's wife.  He was found to be in V-fib arrest by EMS and received a total of 20 minutes of CPR.  He received 5 shocks, 3 rounds of epinephrine, 450 mg of amiodarone.  He arrived on epi gtt. with a King airway in place.  He did receive 1 L normal saline and 100 mcg of fentanyl for sedation.  CBG with EMS 114.  On arrival, the patient was unresponsive, King airway in place.   On chart review, the patient was found to have primary hepatic neoplasm, possible hepatocellular carcinoma, with the following findings on CT imaging: CT abdomen pelvis: IMPRESSION: 1. New moderate volume abdominal/pelvic ascites with higher attenuation fluid in the right upper quadrant and tracking down the right paracolic gutter and into the dependent portion of the pelvis. Possible bleeding from one of the liver lesions. Has there been a recent liver biopsy? 2. Enlarged liver with multiple hepatic masses. 3. Cholelithiasis. 4. Stable right renal calculus. 5. Aortic atherosclerosis.  CT Chest: IMPRESSION: 1. No definite evidence of metastatic disease in the chest. 2. Scattered small pulmonary nodules bilaterally are nonspecific, although likely benign. Attention on follow-up recommended. 3. Moderate-sized right pleural effusion with right lower lobe atelectasis. 4. Unchanged appearance of the visualized upper abdomen compared with CT performed yesterday. Extensive  infiltrating tumor throughout the liver with underlying cirrhosis and moderate volume of ascites. Marked extrinsic compression of the IVC which appears nearly occluded.  He  underwent IR paracentesis while inpatient. He was discharged on 10/17 and was a no show for his subsequent oncology/chemotherapy appointments.   On arrival, the patient was unresponsive, King airway in place, vitally stable with a intact femoral pulse.  Tachycardic on cardiac telemetry to the 120s, normotensive on initial blood pressure measurements, afebrile, saturating well with a King airway in place.  Patient subsequently emergently intubated for airway protection in the setting of altered mental status and unresponsiveness postarrest.  He was transitioned to a Levophed gtt. and administered an additional bolus of 1 L LR. His Levophed requirement escalated to 66mg/min. He had been activated as a CODE STEMI based on his EMS 12 lead which was subsequently cancelled on patient arrival by cardiology.  Initial laboratory evaluation revealed a VBG with a pH of 7.24, pCO2 of 32.6, ABG with improvement with a pH of 7.33, pO2 153, pCO2 31, HCO3 16.5.   INR was mildly elevated at 1.4, CBC with a leukocytosis to 11.5, anemia to 9.0, improved from prior measurements outpatient.  Remainder of laboratory evaluation to include BNP, troponin, lactic acid, Tylenol salicylate, ethanol levels, UDS, urinalysis, urine culture, magnesium level collected and pending.  A chest x-ray postintubation revealed the following: IMPRESSION:  1. Well-positioned endotracheal tube.  2. Low lung volumes with hazy airspace opacities centrally, most  prominently in the upper lobes. Findings may reflect pulmonary  edema, multifocal pneumonia or atelectasis.    Given the patient's history of hepatocellular carcinoma, I did order a CT abdomen pelvis.  He is abdomen appears distended concerning for ascites.  CTA PE study was ordered to evaluate for PE in the setting of patient's cardiac arrest.  CT head and C-spine was also ordered and pending.  Consulted pulmonary critical care for admission to the ICU for shock status post V-fib  arrest, further management postarrest, continued ventilator management given the patient's hypoxic respiratory failure and mechanical ventilation status.  I was informed by nursing staff that the patients PIV placed by EMS had likely infiltrated. He had fluids running in addition to Levophed through that IV. PCCM notified by nursing.   CT Head & Cervical Spine: IMPRESSION:  HEAD CT    1. Patchy areas predominantly subcortical white matter  hypoattenuation that may all be due to chronic ischemic change.  Acute/recent white matter infarction is not excluded, however.  Consider follow-up brain MRI without contrast.  2. No intracranial hemorrhage.    CERVICAL CT    1. No fracture or acute finding.  2. Nonspecific soft tissue air extending from the right skull base  along the right neck to the neck base.   CTA PE: pending CT Abdomen Pelvis: pending  I did discuss the care of the patient with his wife who was present bedside. She stated that he had not pursued care after his diagnosis of likely hepatocellular carcinoma. Initial troponin post arrest was elevated at 537. Pt previously had been code STEMI, canceled by cardiology based on his initial EKG. Cardiology consulted for recommendations. Pt subsequently admitted to the ICU for further care and management post-arrest. Per the patient's wife, he remains a full code. Had some runs of non-sustained VT and in discussed with PCCM, re-bolused Amio and retarted Amio gtt.   Post admission, I performed a right femoral central venous line at the request of PCCM. The patient was then admitted  to the ICU in critical condition.   Final Clinical Impression(s) / ED Diagnoses Final diagnoses:  Cardiac arrest [facility])    Rx / [place] Orders ED Discharge Orders     None         Regan Lemming, MD 2022-09-14 1442    Regan Lemming, MD 09-14-2022 8309    Regan Lemming, MD 09-14-2022 1537

## 2022-08-31 NOTE — Progress Notes (Signed)
   09-19-2022 1725  Clinical Encounter Type  Visited With Family;Health care provider  Visit Type Initial;ED;Death    Chaplain responded to a death in the ED. Met with patient's family, including his daughter Windel Keziah - [phone]; [address] [employer], [place] [address]). Daughter indicated they will need a few days to determine a funeral home. I have given them the card for Patient Placement. Chaplain extended hospitality. Chaplain assisted in facilitating medical consultation with the family.  Chaplain introduced spiritual care services. Spiritual care services available as needed.   Jeri Lager, Chaplain 2022/09/19

## 2022-08-31 NOTE — ED Notes (Signed)
At approx. 1452 We had take this pt to CT prior to going to ICU bed.  We were about to begin scanning when we noticed the monitor alarming.  The pt was found to be in what looked like vfib arrest.  A code was called and we began CPR immediately.  After several rounds of CPR and multiple medications, the CCM doctors spoke with family and it was agreed to end the code. Time of Death was called at 15:24.

## 2022-08-31 NOTE — Death Summary Note (Addendum)
DEATH SUMMARY   Patient Details  Name: Mark Villarreal MRN: [id number] DOB: May 07, 1961  Admission/Discharge Information   Admit Date:  September 04, 2022  Date of Death: Date of Death: 09/04/22  Time of Death: Time of Death: 11-24-22  Length of Stay: 1  Referring Physician: Lucianne Lei, MD   Reason(s) for Hospitalization  Cardiac Arrest  Diagnoses  Preliminary cause of death:  Cardiac Arrest  Secondary Diagnoses (including complications and co-morbidities):  Principal Problem:   Cardiac arrest [facility]) Acute Hypoxemic Respiratory Failure Ventricular Fibrillation Hepatocellular Carcinoma Severe Protein Calorie Malnutrition  Brief Hospital Course (including significant findings, care, treatment, and services provided and events leading to death)  Mark SCURLOCK is a 62 y.o. year old male with with cirrhosis, hepatitis C, hepatocellular carcinoma and hypertension who presented to the ED as a v. Fib cardiac arrest. He underwent 20 minutes of CPR in the field with ROSC. He was intubated in the ER. He was started on levophed for hypotension. He had head CT scan done and showed signs of v. Tach so he was returned to his ER room. CT head did not indicate any cerebral hemorrhage. A central line was placed by the ER team and he was started on amiodarone for the v. Tach. Discussion with patient's wife regarding code status and she said he would want to be full code.   He stabilized and then taken to CT again to complete CT Chest PE study and CT abdomen. While in the CT suite he had cardiac arrest again. He under went a total of 12 rounds of CPR. He never regained pulse with v. Fib and PEA activity on checks. He received amiodarone and defibrillation shocks along with epinephrine pushes throughout. He was given TPA for concern of pulmonary embolism given his cancer history. Despite these aggressive measures return of spontaneous circulation was not achieved. Patient passed away at 1524.     Pertinent Labs and Studies  Significant Diagnostic Studies CT HEAD WO CONTRAST (5MM)  Result Date: 09-04-22 CLINICAL DATA:  Witnessed cardiac arrest.  CPR performed. EXAM: CT HEAD WITHOUT CONTRAST CT CERVICAL SPINE WITHOUT CONTRAST TECHNIQUE: Multidetector CT imaging of the head and cervical spine was performed following the standard protocol without intravenous contrast. Multiplanar CT image reconstructions of the cervical spine were also generated. RADIATION DOSE REDUCTION: This exam was performed according to the departmental dose-optimization program which includes automated exposure control, adjustment of the mA and/or kV according to patient size and/or use of iterative reconstruction technique. COMPARISON:  None Available. FINDINGS: CT HEAD FINDINGS Brain: Ventricles are normal in size and configuration. There are no parenchymal masses or mass effect. Patchy areas of white matter hypoattenuation are noted, predominantly along the subcortical white matter, that may all reflect chronic ischemic change. Acute/recent white matter infarcts, however, are not excluded. There is no evidence, however, involvement of the overlying cortex. No extra-axial masses or abnormal fluid collections. No intracranial hemorrhage. Vascular: No hyperdense vessel or unexpected calcification. Skull: Normal. Negative for fracture or focal lesion. Sinuses/Orbits: Globes and orbits are unremarkable. Visualized sinuses are clear. Other: Soft tissue air at the right skull base and visualized right upper neck. Small amount of air noted adjacent to the cavernous internal carotid arteries and posterior sella. CT CERVICAL SPINE FINDINGS Alignment: Normal. Skull base and vertebrae: No acute fracture. No primary bone lesion or focal pathologic process. Soft tissues and spinal canal: No prevertebral fluid or swelling. No visible canal hematoma. Soft tissue air tracks along the right neck, seen also at  the neck base. Source of this  air is unclear. Disc levels: Minor loss of disc height at C3-C4. Mild loss of disc height at C5-C6 and C6-C7. Mild disc bulging with small endplate spurs. No evidence of a disc herniation. Uncovertebral spurring causes mild right and moderate left neural foraminal narrowing at C6-C7. Upper chest: Patient intubated, tip of the ET tube below the included field of view. Right pleural effusion. Hazy opacities in the visualized upper lungs. Other: None. IMPRESSION: HEAD CT 1. Patchy areas predominantly subcortical white matter hypoattenuation that may all be due to chronic ischemic change. Acute/recent white matter infarction is not excluded, however. Consider follow-up brain MRI without contrast. 2. No intracranial hemorrhage. CERVICAL CT 1. No fracture or acute finding. 2. Nonspecific soft tissue air extending from the right skull base along the right neck to the neck base. Electronically Signed   By: Lajean Manes M.D.   On: 09-02-22 13:49   CT Cervical Spine Wo Contrast  Result Date: 2022/09/02 CLINICAL DATA:  Witnessed cardiac arrest.  CPR performed. EXAM: CT HEAD WITHOUT CONTRAST CT CERVICAL SPINE WITHOUT CONTRAST TECHNIQUE: Multidetector CT imaging of the head and cervical spine was performed following the standard protocol without intravenous contrast. Multiplanar CT image reconstructions of the cervical spine were also generated. RADIATION DOSE REDUCTION: This exam was performed according to the departmental dose-optimization program which includes automated exposure control, adjustment of the mA and/or kV according to patient size and/or use of iterative reconstruction technique. COMPARISON:  None Available. FINDINGS: CT HEAD FINDINGS Brain: Ventricles are normal in size and configuration. There are no parenchymal masses or mass effect. Patchy areas of white matter hypoattenuation are noted, predominantly along the subcortical white matter, that may all reflect chronic ischemic change. Acute/recent white  matter infarcts, however, are not excluded. There is no evidence, however, involvement of the overlying cortex. No extra-axial masses or abnormal fluid collections. No intracranial hemorrhage. Vascular: No hyperdense vessel or unexpected calcification. Skull: Normal. Negative for fracture or focal lesion. Sinuses/Orbits: Globes and orbits are unremarkable. Visualized sinuses are clear. Other: Soft tissue air at the right skull base and visualized right upper neck. Small amount of air noted adjacent to the cavernous internal carotid arteries and posterior sella. CT CERVICAL SPINE FINDINGS Alignment: Normal. Skull base and vertebrae: No acute fracture. No primary bone lesion or focal pathologic process. Soft tissues and spinal canal: No prevertebral fluid or swelling. No visible canal hematoma. Soft tissue air tracks along the right neck, seen also at the neck base. Source of this air is unclear. Disc levels: Minor loss of disc height at C3-C4. Mild loss of disc height at C5-C6 and C6-C7. Mild disc bulging with small endplate spurs. No evidence of a disc herniation. Uncovertebral spurring causes mild right and moderate left neural foraminal narrowing at C6-C7. Upper chest: Patient intubated, tip of the ET tube below the included field of view. Right pleural effusion. Hazy opacities in the visualized upper lungs. Other: None. IMPRESSION: HEAD CT 1. Patchy areas predominantly subcortical white matter hypoattenuation that may all be due to chronic ischemic change. Acute/recent white matter infarction is not excluded, however. Consider follow-up brain MRI without contrast. 2. No intracranial hemorrhage. CERVICAL CT 1. No fracture or acute finding. 2. Nonspecific soft tissue air extending from the right skull base along the right neck to the neck base. Electronically Signed   By: Lajean Manes M.D.   On: 2022-09-02 13:49   DG Chest Port 1 View  Result Date: 09/02/22 CLINICAL DATA:  Intubation. EXAM: PORTABLE CHEST 1  VIEW COMPARISON:  06/10/2022 and older exams. FINDINGS: Endotracheal tube tip lies 1 cm above the carina. Heart is normal in size.  No mediastinal or hilar masses. Low lung volumes. Hazy bilateral central airspace lung opacities are noted, most evident in the upper lobes. No convincing pneumothorax. Skeletal structures are grossly intact. IMPRESSION: 1. Well-positioned endotracheal tube. 2. Low lung volumes with hazy airspace opacities centrally, most prominently in the upper lobes. Findings may reflect pulmonary edema, multifocal pneumonia or atelectasis. Electronically Signed   By: Lajean Manes M.D.   On: 09-04-2022 12:09    Microbiology Recent Results (from the past 240 hour(s))  Resp panel by RT-PCR (RSV, Flu A&B, Covid) Anterior Nasal Swab     Status: None   Collection Time: 2022/09/04 12:43 PM   Specimen: Anterior Nasal Swab  Result Value Ref Range Status   SARS Coronavirus 2 by RT PCR NEGATIVE NEGATIVE Final    Comment: (NOTE) SARS-CoV-2 target nucleic acids are NOT DETECTED.  The SARS-CoV-2 RNA is generally detectable in upper respiratory specimens during the acute phase of infection. The lowest concentration of SARS-CoV-2 viral copies this assay can detect is 138 copies/mL. A negative result does not preclude SARS-Cov-2 infection and should not be used as the sole basis for treatment or other patient management decisions. A negative result may occur with  improper specimen collection/handling, submission of specimen other than nasopharyngeal swab, presence of viral mutation(s) within the areas targeted by this assay, and inadequate number of viral copies(<138 copies/mL). A negative result must be combined with clinical observations, patient history, and epidemiological information. The expected result is Negative.  Fact Sheet for Patients:  EntrepreneurPulse.com.au  Fact Sheet for Healthcare Providers:  IncredibleEmployment.be  This test is no  t yet approved or cleared by the [place] FDA and  has been authorized for detection and/or diagnosis of SARS-CoV-2 by FDA under an Emergency Use Authorization (EUA). This EUA will remain  in effect (meaning this test can be used) for the duration of the COVID-19 declaration under Section 564(b)(1) of the Act, 21 U.S.C.section 360bbb-3(b)(1), unless the authorization is terminated  or revoked sooner.       Influenza A by PCR NEGATIVE NEGATIVE Final   Influenza B by PCR NEGATIVE NEGATIVE Final    Comment: (NOTE) The Xpert Xpress SARS-CoV-2/FLU/RSV plus assay is intended as an aid in the diagnosis of influenza from Nasopharyngeal swab specimens and should not be used as a sole basis for treatment. Nasal washings and aspirates are unacceptable for Xpert Xpress SARS-CoV-2/FLU/RSV testing.  Fact Sheet for Patients: EntrepreneurPulse.com.au  Fact Sheet for Healthcare Providers: IncredibleEmployment.be  This test is not yet approved or cleared by the [place] FDA and has been authorized for detection and/or diagnosis of SARS-CoV-2 by FDA under an Emergency Use Authorization (EUA). This EUA will remain in effect (meaning this test can be used) for the duration of the COVID-19 declaration under Section 564(b)(1) of the Act, 21 U.S.C. section 360bbb-3(b)(1), unless the authorization is terminated or revoked.     Resp Syncytial Virus by PCR NEGATIVE NEGATIVE Final    Comment: (NOTE) Fact Sheet for Patients: EntrepreneurPulse.com.au  Fact Sheet for Healthcare Providers: IncredibleEmployment.be  This test is not yet approved or cleared by the [place] FDA and has been authorized for detection and/or diagnosis of SARS-CoV-2 by FDA under an Emergency Use Authorization (EUA). This EUA will remain in effect (meaning this test can be used) for the duration of the COVID-19 declaration  under Section  564(b)(1) of the Act, 21 U.S.C. section 360bbb-3(b)(1), unless the authorization is terminated or revoked.  Performed at [facility], [place] [address] [place], [place] [address]     Lab Basic Metabolic Panel: Recent Labs  Lab 2022-09-02 1147 09/02/2022 1202 02-Sep-2022 1232  NA 126* 124* 126*  K 4.7 4.8 4.6  CL 97*  --   --   CO2 13*  --   --   GLUCOSE 216*  --   --   BUN 25*  --   --   CREATININE 2.26*  --   --   CALCIUM 7.8*  --   --   MG 2.1  --   --    Liver Function Tests: Recent Labs  Lab 09/02/2022 1147  AST 149*  ALT 87*  ALKPHOS 76  BILITOT 0.7  PROT 5.3*  ALBUMIN <1.5*   No results for input(s): "LIPASE", "AMYLASE" in the last 168 hours. No results for input(s): "AMMONIA" in the last 168 hours. CBC: Recent Labs  Lab 02-Sep-2022 1147 09-02-2022 1202 September 02, 2022 1232  WBC 11.5*  --   --   NEUTROABS 9.7*  --   --   HGB 9.0* 10.5* 9.2*  HCT 26.8* 31.0* 27.0*  MCV 81.0  --   --   PLT 452*  --   --    Cardiac Enzymes: No results for input(s): "CKTOTAL", "CKMB", "CKMBINDEX", "TROPONINI" in the last 168 hours. Sepsis Labs: Recent Labs  Lab Sep 02, 2022 1147 02-Sep-2022 1219  WBC 11.5*  --   LATICACIDVEN  --  6.7*    Procedures/Operations  Endotracheal Intubation Central Line Placement Cardiopulmonary Resuscitation   Freddi Starr 08/23/2022, 1:26 PM

## 2022-08-31 NOTE — Code Documentation (Signed)
TNK '45mg'$ 

## 2022-08-31 NOTE — ED Notes (Signed)
Patient Placement notified that Post mortem Documentation is done and pt is in [place].

## 2022-08-31 NOTE — Progress Notes (Addendum)
NAME:  Mark Villarreal, MRN:  [id number], DOB:  07-14-1961, LOS: 0 ADMISSION DATE:  2022/08/29, CONSULTATION DATE:  08-29-2022 REFERRING MD: Regan Lemming , CHIEF COMPLAINT:  Cardiac Arrest, hepatocellular  carcinoma   History of Present Illness:   62 year old male with medical history significant for chronic hepatitis C s/p treatment, liver cirrhosis, HTN, GERD, arthritis who presents to the emergency department after a witnessed V-fib arrest.  Bystander CPR was initiated on scene after a witnessed arrest by the patient's wife.  He was found to be in V-fib arrest by EMS and received a total of 20 minutes of CPR.  He received 5 shocks, 3 rounds of epinephrine, 450 mg of amiodarone.  He arrived on epi gtt. with a King airway in place.  He did receive 1 L normal saline and 100 mcg of fentanyl for sedation.  CBG with EMS 114.  On arrival, the patient was unresponsive, King airway in place.    In the ED he was intubated. He became hypotensive after intubation and was initially started on epi , but has been transitioned to Levophed. Current rate is 24 mcg/ minute. Last BP was 86/69. Pt. Has been unresponsive since arriving in the ED. The only lab I have at present is a VBG, all others are pending. He was given one amp of bicarb in the ED for pH of  7.239/ 32.6/ 206/14 I stat K 4.8/ Na 124/HGB 10.5 ABG  resulted 7.33/31.1/153/16.5 I stat Na 126/ K 4.6/ HGB 9.2  Lactate 6.7, Troponin 537   CXR 12/23 shows well positioned ETT and Low lung volumes with hazy airspace opacities centrally, most prominently in the upper lobes. Findings may reflect pulmonary edema, multifocal pneumonia or atelectasis. CT head/ CTA Chest and CT Abdomen and pelvis are pending.  Of note patient was found to have primary hepatic neoplasm, possible hepatocellular carcinoma, 05/2022. He underwent  IR paracentesis while inpatient. He was discharged on 10/17 /23 and was a no show for his subsequent oncology/chemotherapy  appointments.  Per his wife he opted not to treat his hepatocellular carcinoma, however they did not discuss code status  PCCM have been asked to admit patient to ICU and manage care.    Pertinent  Medical History   Past Medical History:  Diagnosis Date   Acute kidney injury ([place])    Arthritis    knee and neck   Chest pain 06/23/2018   Cholelithiasis 05/2018   COVID 2022   mild case   GERD (gastroesophageal reflux disease)    Hepatic steatosis    Hypertension    Hepatocellular carcinoma 2023  Significant Hospital Events: Including procedures, antibiotic start and stop dates in addition to other pertinent events   08/29/22 Admission to [facility] ED post Cardiac Arrest  Interim History / Subjective:  As above, currently on levophed , intubated , unresponsive  Objective   Blood pressure (!) 86/69, pulse (!) 127, temperature 97.8 F (36.6 C), temperature source Rectal, resp. rate 20, height '5\' 10"'$  (1.778 m), SpO2 100 %.    Vent Mode: PRVC FiO2 (%):  [100 %] 100 % Set Rate:  [20 bmp] 20 bmp Vt Set:  [580 mL] 580 mL PEEP:  [5 cmH20] 5 cmH20 Plateau Pressure:  [26 cmH20] 26 cmH20  No intake or output data in the 24 hours ending August 29, 2022 1243 There were no vitals filed for this visit.  Examination: General:  Unresponsive , ventilated on pressors HENT:  NCAT, No LAD, Trace JVD Lungs: Bilateral chest excursion, diminished  throughout Cardiovascular:  S1, S2,  but ST  per tele  Abdomen:  Large, distended but soft , diminished BS Extremities:  Cool to touch, thin with muscle wasting Neuro:  Unresponsive GU:  Not assessed  Resolved Hospital Problem list     Assessment & Plan:  Witnessed cardiac arrest with immediate bystander CPR  then EMS management x 20 minutes Defib x 5, 3 rounds of Epi Intubated in ED Plan ICU admission  Titrate Levophed for MAP > 65 Amiodarone gtt  Maintain K of > 4 and Mag of > 2 Maintain  ABG now and prn Trend BNP Trend Lactate Will consider  temp management if spikes temp( Currently awaiting CT Results)  Echo ordered, follow up results 1 L LR bolus now  for lactate of 6.7  Respiratory Failure in setting of Cardiac Arrest Plan Continue ventilator support with lung protective strategies  Wean PEEP and FiO2 for sats greater than 90%. Head of bed elevated 30 degrees. Plateau pressures less than 30 cm H20.  Follow intermittent chest x-ray and ABG.  >> worsening effusions and pulmonary edema 3/13 SAT/SBT as tolerated, mentation preclude extubation  Ensure adequate pulmonary hygiene  Follow cultures  VAP bundle in place  PAD protocol   ? Etiology of Cardiac Arrest arrest Plan Blood Cx x 2 Broad spectrum antibiotics ( Vanc , Cefepime) per pharmacy Trend fever and WBC Sputum Cx , follow results Urine Cx, follow results  Hepatocellular Carcinoma Decided against treatment 05/2022 Plan CT Abdomen Pelvis to assess for worsening metastasis Trend LFT Continued Goals of care discussions after  CT Abdomen pelvis results  Nutrition  Plan Dietitian consult for initiation of tube feeds    Best Practice (right click and "Reselect all SmartList Selections" daily)   Diet/type: NPO DVT prophylaxis: SCD GI prophylaxis: H2B Lines: N/A Foley:  Yes, and it is still needed Code Status:  full code Last date of multidisciplinary goals of care discussion [ongoing]  Labs   CBC: Recent Labs  Lab 2022-09-13 1147 09/13/22 1202 09-13-2022 1232  WBC 11.5*  --   --   NEUTROABS PENDING  --   --   HGB 9.0* 10.5* 9.2*  HCT 26.8* 31.0* 27.0*  MCV 81.0  --   --   PLT 452*  --   --     Basic Metabolic Panel: Recent Labs  Lab 09/13/22 1202 09/13/22 1232  NA 124* 126*  K 4.8 4.6   GFR: CrCl cannot be calculated (Patient's most recent lab result is older than the maximum 21 days allowed.). Recent Labs  Lab 2022-09-13 1147  WBC 11.5*    Liver Function Tests: No results for input(s): "AST", "ALT", "ALKPHOS", "BILITOT", "PROT",  "ALBUMIN" in the last 168 hours. No results for input(s): "LIPASE", "AMYLASE" in the last 168 hours. No results for input(s): "AMMONIA" in the last 168 hours.  ABG    Component Value Date/Time   PHART 7.332 (L) September 13, 2022 1232   PCO2ART 31.1 (L) 2022/09/13 1232   PO2ART 153 (H) September 13, 2022 1232   HCO3 16.5 (L) 09/13/2022 1232   TCO2 17 (L) 13-Sep-2022 1232   ACIDBASEDEF 9.0 (H) 13-Sep-2022 1232   O2SAT 99 09/13/2022 1232     Coagulation Profile: Recent Labs  Lab 2022-09-13 1147  INR 1.4*    Cardiac Enzymes: No results for input(s): "CKTOTAL", "CKMB", "CKMBINDEX", "TROPONINI" in the last 168 hours.  HbA1C: No results found for: "HGBA1C"  CBG: No results for input(s): "GLUCAP" in the last 168 hours.  Review of Systems:  Unable as unresponsive / intubated  Past Medical History:  He,  has a past medical history of Acute kidney injury ([place]), Arthritis, Chest pain (06/23/2018), Cholelithiasis (05/2018), COVID (2022), GERD (gastroesophageal reflux disease), Hepatic steatosis, and Hypertension.   Surgical History:   Past Surgical History:  Procedure Laterality Date   COLONOSCOPY     IR PARACENTESIS  06/15/2022   RADIOLOGY WITH ANESTHESIA N/A 04/28/2022   Procedure: MRI abdomen with and without contrast WITH ANESTHESIA;  Surgeon: Radiologist, Medication, MD;  Location: [facility];  Service: Radiology;  Laterality: N/A;   WRIST SURGERY Right      Social History:   reports that he has never smoked. He has never used smokeless tobacco. He reports current alcohol use. He reports that he does not use drugs.   Family History:  His family history is not on file.   Allergies No Known Allergies   Home Medications  Prior to Admission medications   Medication Sig Start Date End Date Taking? Authorizing Provider  BYSTOLIC 20 MG TABS Take 20 mg by mouth daily.  03/06/20   [provider]  ferrous sulfate 325 (65 FE) MG tablet Take 1 tablet (325 mg total) by mouth daily with  breakfast. 06/17/22 09/15/22  Dahal, Marlowe Aschoff, MD  ondansetron (ZOFRAN) 8 MG tablet Take 1 tablet (8 mg total) by mouth every 8 (eight) hours as needed for nausea or vomiting. 06/16/22   Brunetta Genera, MD  Probiotic Product (PROBIOTIC DAILY) CAPS Take 1 capsule by mouth daily. Ultra Flora Probiotic - prescribed by Dr. Collene Mares     [provider]  prochlorperazine (COMPAZINE) 10 MG tablet Take 1 tablet (10 mg total) by mouth every 6 (six) hours as needed for nausea or vomiting. 06/16/22   Brunetta Genera, MD     Critical care time: 67 minutes    Magdalen Spatz, MSN, AGACNP-BC [place] for personal pager PCCM on call pager [phone]  2:04 PM 2022/08/26

## 2022-08-31 NOTE — Progress Notes (Signed)
RT transported pt to CT and back to room on full vent support. No complications noted.

## 2022-08-31 NOTE — Progress Notes (Signed)
Pharmacy Antibiotic Note  Mark Villarreal is a 62 y.o. male admitted on Sep 19, 2022 presenting post-arrest, now concern for sepsis.  Pharmacy has been consulted for vancomycin and cefepime dosing.  Plan: Vancomycin 1500 mg IV x 1, then 1g IV every 24 hours (eAUC 484) Cefepime 2g IV every 24 hours Monitor renal function, Cx and clinical progression to narrow Vancomycin levels as indicated  Height: '5\' 10"'$  (177.8 cm) IBW/kg (Calculated) : 73  Temp (24hrs), Avg:97.8 F (36.6 C), Min:97.7 F (36.5 C), Max:97.8 F (36.6 C)  Recent Labs  Lab 09/19/2022 1147 09/19/22 1219  WBC 11.5*  --   CREATININE 2.26*  --   LATICACIDVEN  --  6.7*    CrCl cannot be calculated (Unknown ideal weight.).    No Known Allergies  Bertis Ruddy, PharmD, [facility] Clinical Pharmacist ED Pharmacist Phone # [phone] 09-19-2022 2:33 PM

## 2022-08-31 DEATH — deceased

## 2022-10-22 MED FILL — Medication: Qty: 1 | Status: AC
